# Patient Record
Sex: Female | Born: 1970 | Hispanic: No | Marital: Single | Smoking: Never smoker
Health system: Southern US, Community
[De-identification: ages and names within clinical notes are randomized; demographics above are authoritative.]

## PROBLEM LIST (undated history)

## (undated) DIAGNOSIS — M1990 Unspecified osteoarthritis, unspecified site: Secondary | ICD-10-CM

## (undated) DIAGNOSIS — D582 Other hemoglobinopathies: Secondary | ICD-10-CM

## (undated) DIAGNOSIS — E559 Vitamin D deficiency, unspecified: Secondary | ICD-10-CM

## (undated) HISTORY — DX: Other hemoglobinopathies: D58.2

## (undated) HISTORY — DX: Unspecified osteoarthritis, unspecified site: M19.90

## (undated) HISTORY — DX: Vitamin D deficiency, unspecified: E55.9

## (undated) HISTORY — PX: WISDOM TOOTH EXTRACTION: SHX21

---

## 2016-12-05 NOTE — L&D Delivery Note (Signed)
Delivery Note At 10:32 PM, on Sep 24, 2017,  a viable female "Evelyn Clark" was delivered via Vaginal, Spontaneous Delivery (Presentation: Left Occiput Anterior with restitution to LOT).  Shoulders delivered easily and infant with good tone and spontaneous cry. Tactile stimulation given by provider and infant placed on mother's abdomen where nurse continued tactile stimulation. Infant APGAR: 8, 9.  Cord clamped, cut, and blood collected. Placenta delivered spontaneously and noted to be intact with 3VC upon inspection. Placenta to pathology for elevated BP throughout labor course.  Vaginal inspection revealed bilateral labial and bilateral sulcus lacerations that was repaired with 3-0 vicryl on SH and CT-1, respectively.  No additional anesthetic necessary and patient tolerated the procedure well. Fundus firm, at the umbilicus, and bleeding moderate with constant trickle.  Bimanual sweep of the LUS revealed ~12800mL of clots which was extracted and bleeding decreased to small.  Mother hemodynamically stable and infant skin to skin prior to provider exit.  Mother opts to breastfeed.  Infant weight at one hour of life: 6lb 8oz, 19in  Anesthesia:  Epidural Episiotomy: None Lacerations: Sulcus;Labial Suture Repair: 3.0 vicryl Est. Blood Loss (mL):  800  Mom to postpartum.  Baby to Couplet care / Skin to Skin.  Cherre RobinsJessica L Nolawi Kanady MSN, CNM 09/24/2017, 11:53 PM

## 2017-03-23 LAB — OB RESULTS CONSOLE ANTIBODY SCREEN: Antibody Screen: NEGATIVE

## 2017-03-23 LAB — OB RESULTS CONSOLE HIV ANTIBODY (ROUTINE TESTING): HIV: NONREACTIVE

## 2017-03-23 LAB — OB RESULTS CONSOLE RPR: RPR: NONREACTIVE

## 2017-03-23 LAB — OB RESULTS CONSOLE ABO/RH: RH TYPE: POSITIVE

## 2017-03-23 LAB — OB RESULTS CONSOLE RUBELLA ANTIBODY, IGM: Rubella: IMMUNE

## 2017-03-23 LAB — OB RESULTS CONSOLE GC/CHLAMYDIA
CHLAMYDIA, DNA PROBE: NEGATIVE
GC PROBE AMP, GENITAL: NEGATIVE

## 2017-03-23 LAB — OB RESULTS CONSOLE HEPATITIS B SURFACE ANTIGEN: Hepatitis B Surface Ag: NEGATIVE

## 2017-09-21 ENCOUNTER — Encounter (HOSPITAL_COMMUNITY): Payer: Self-pay | Admitting: *Deleted

## 2017-09-21 ENCOUNTER — Telehealth (HOSPITAL_COMMUNITY): Payer: Self-pay | Admitting: *Deleted

## 2017-09-21 LAB — OB RESULTS CONSOLE GBS: STREP GROUP B AG: NEGATIVE

## 2017-09-21 NOTE — Telephone Encounter (Signed)
Preadmission screen  

## 2017-09-22 ENCOUNTER — Other Ambulatory Visit: Payer: Self-pay | Admitting: Obstetrics and Gynecology

## 2017-09-22 ENCOUNTER — Inpatient Hospital Stay (HOSPITAL_COMMUNITY): Admission: AD | Admit: 2017-09-22 | Payer: Self-pay | Source: Ambulatory Visit | Admitting: Obstetrics and Gynecology

## 2017-09-23 DIAGNOSIS — E559 Vitamin D deficiency, unspecified: Secondary | ICD-10-CM | POA: Insufficient documentation

## 2017-09-23 DIAGNOSIS — O09529 Supervision of elderly multigravida, unspecified trimester: Secondary | ICD-10-CM

## 2017-09-23 DIAGNOSIS — Z8742 Personal history of other diseases of the female genital tract: Secondary | ICD-10-CM | POA: Insufficient documentation

## 2017-09-23 NOTE — H&P (Signed)
Evelyn Clark is a 46 y.o. female, G2P0010 at 3440 2/7 weeks, [facility] She denies HA, visual sx, or epigastric pain, no leaking or bleeding, reports +FM.  Patient Active Problem List   Diagnosis Date Noted  . AMA (advanced maternal age) multigravida 35+ 09/23/2017  . History of infertility 09/23/2017  . Newborn product of in vitro fertilization (IVF) pregnancy 09/23/2017  . Vitamin D deficiency 09/23/2017    History of present pregnancy: Patient entered care at 13 5/7 weeks.   EDC of 09/22/17 was established by LMP and in agreement with US at 6 4/7 weeks.  Anatomy scan:  19 4/7 weeks, with normal findings and an anterior placenta.   Additional US evaluations:  Early scans at 14 and 17 weeks for cervical length; Weekly BPPs from 36 weeks due to AMA--all WNL 36 6/7 weeks--EFW 6+9, 47%ile, normal AFI, vtx. Significant prenatal events:  IVF pregnancy, followed by Dr. Lacey JensenYacinkawa.  Patient questioned need for early cerclage, but no indications, and she elected not to pursue.  Cervical length remained WNL in early pregnancy.  Had issues with appetite, burping, sciatica, general musculoskeletal issues of pregnancy.  Stopped work at 28 weeks due to stress and other issues.  Followed weekly with BPPs from 36 [facility]. Last evaluation:  09/20/17--seen by Dr. Normand Sloopillard, weight 125.5, BP 106/62.  Last documented cervical exam was at 38 5/7 weeks, 1 cm, 90%, vtx, 0 station. Scheduled for induction due to AMA.  OB History    Gravida Para Term Preterm AB Living   2       1     SAB TAB Ectopic Multiple Live Births   1            2003--TAB  Past Medical History:  Diagnosis Date  . Arthritis   . Hemoglobin E trait (HCC)   . Newborn product of in vitro fertilization (IVF) pregnancy   . Vitamin D deficiency    Past Surgical History:  Procedure Laterality Date  . WISDOM TOOTH EXTRACTION     Family History: family history includes Breast cancer in her maternal  aunt; Hyperlipidemia in her mother; Hypertension in her father and mother; Osteoporosis in her mother. Brother with liver transplant.  Social History:  reports that she has never smoked. She has never used smokeless tobacco. Her alcohol and drug histories are not on file.  Patient is [place], from [place], college-educated, has been employed as a Conservation officer, naturecashier at [employer], married to Milana Kidneyick Turpin, who is involved and supportive.   Prenatal Transfer Tool  Maternal Diabetes: No Genetic Screening: Normal AFP Maternal Ultrasounds/Referrals: Normal Fetal Ultrasounds or other Referrals:  None Maternal Substance Abuse:  No Significant Maternal Medications:  None Significant Maternal Lab Results: Lab values include: Group B Strep negative  TDAP declined Flu declined  ROS:  Occasional UCs, positive FM  No Known Allergies   Dilation: 1 Effacement (%): 90 Station: 0 Exam by:: CNM Jakarie Pember Blood pressure 111/79, pulse (!) 58, temperature 97.7 F (36.5 C), temperature source Oral, resp. rate 16, height 5\' 4"  (1.626 m), weight 58.1 kg (128 lb), last menstrual period 12/16/2016.  Chest clear Heart RRR without murmur Abd gravid, NT, FH 40 weeks Pelvic: Cervix very posterior, loose 1 cm, 90%, vtx, 0 station, exam difficult for patient. Ext: WNL  FHR: Category 1 UCs:  Sporadic, mild.  Prenatal labs: ABO, Rh: O/Positive/-- (04/19 0000) Antibody: Negative (04/19 0000) Rubella:  Immune (04/19 0000) RPR: Nonreactive (04/19 0000)  HBsAg: Negative (  04/19 0000)  HIV: Non-reactive (04/19 0000)  GBS: Negative (10/18 0000) Sickle cell/Hgb electrophoresis:  Hgb E Pap:  02/2017, WNL GC:  Neg 03/2017 Chlamydia:  Neg 03/2017 Genetic screenings:  AFP WNL Glucola:  WNL Other:   Hgb 12.6 at NOB, 11.4 at 28 weeks   Assessment/Plan: IUP at 40 2/7 weeks Induction for AMA, age 1. Hx infertility, IVF pregnancy (donor egg from 46 yo) Hgb E trait GBS negative  Plan: Admit to Birthing Suite per consult with Dr.  Normand Sloop Routine [facility] orders Pain med/epidural prn Risks and benefits of induction were reviewed, including failure of method, prolonged labor, need for further intervention, risk of cesarean.  Patient and family seem to understand these risks and wish to proceed. Options of cytotech, foley bulb, AROM, and pitocin reviewed, with use of each discussed. Recommend pitocin for labor induction, AROM as labor advances. Patient will plan epidural.   Billi Bright, VICKICNM, [place] 09/24/2017, 9:16 AM

## 2017-09-24 ENCOUNTER — Inpatient Hospital Stay (HOSPITAL_COMMUNITY): Payer: Managed Care, Other (non HMO) | Admitting: Anesthesiology

## 2017-09-24 ENCOUNTER — Encounter (HOSPITAL_COMMUNITY): Payer: Self-pay

## 2017-09-24 ENCOUNTER — Inpatient Hospital Stay (HOSPITAL_COMMUNITY)
Admission: RE | Admit: 2017-09-24 | Discharge: 2017-09-26 | DRG: 806 | Disposition: A | Discharge status: Home / Self Care | Payer: Managed Care, Other (non HMO) | Source: Ambulatory Visit | Attending: Obstetrics and Gynecology | Admitting: Obstetrics and Gynecology

## 2017-09-24 DIAGNOSIS — O9902 Anemia complicating childbirth: Secondary | ICD-10-CM | POA: Diagnosis present

## 2017-09-24 DIAGNOSIS — O09529 Supervision of elderly multigravida, unspecified trimester: Secondary | ICD-10-CM

## 2017-09-24 DIAGNOSIS — O26893 Other specified pregnancy related conditions, third trimester: Principal | ICD-10-CM | POA: Diagnosis present

## 2017-09-24 DIAGNOSIS — E559 Vitamin D deficiency, unspecified: Secondary | ICD-10-CM

## 2017-09-24 DIAGNOSIS — D649 Anemia, unspecified: Secondary | ICD-10-CM | POA: Diagnosis present

## 2017-09-24 DIAGNOSIS — O09523 Supervision of elderly multigravida, third trimester: Secondary | ICD-10-CM

## 2017-09-24 DIAGNOSIS — O9912 Other diseases of the blood and blood-forming organs and certain disorders involving the immune mechanism complicating childbirth: Secondary | ICD-10-CM | POA: Diagnosis present

## 2017-09-24 DIAGNOSIS — D693 Immune thrombocytopenic purpura: Secondary | ICD-10-CM | POA: Diagnosis present

## 2017-09-24 DIAGNOSIS — O134 Gestational [pregnancy-induced] hypertension without significant proteinuria, complicating childbirth: Secondary | ICD-10-CM | POA: Diagnosis present

## 2017-09-24 DIAGNOSIS — Z8742 Personal history of other diseases of the female genital tract: Secondary | ICD-10-CM

## 2017-09-24 DIAGNOSIS — Z3A40 40 weeks gestation of pregnancy: Secondary | ICD-10-CM | POA: Diagnosis not present

## 2017-09-24 LAB — CBC
HCT: 36 % (ref 36.0–46.0)
HEMATOCRIT: 33.6 % — AB (ref 36.0–46.0)
HEMOGLOBIN: 12.2 g/dL (ref 12.0–15.0)
HEMOGLOBIN: 11.1 g/dL — AB (ref 12.0–15.0)
MCH: 25.2 pg — AB (ref 26.0–34.0)
MCH: 25.2 pg — AB (ref 26.0–34.0)
MCHC: 33.9 g/dL (ref 30.0–36.0)
MCHC: 33 g/dL (ref 30.0–36.0)
MCV: 74.2 fL — AB (ref 78.0–100.0)
MCV: 76.2 fL — ABNORMAL LOW (ref 78.0–100.0)
Platelets: 158 10*3/uL (ref 150–400)
Platelets: 143 10*3/uL — ABNORMAL LOW (ref 150–400)
RBC: 4.85 MIL/uL (ref 3.87–5.11)
RBC: 4.41 MIL/uL (ref 3.87–5.11)
RDW: 15.2 % (ref 11.5–15.5)
RDW: 15 % (ref 11.5–15.5)
WBC: 5.3 10*3/uL (ref 4.0–10.5)
WBC: 4.9 10*3/uL (ref 4.0–10.5)

## 2017-09-24 LAB — COMPREHENSIVE METABOLIC PANEL
ALBUMIN: 3.2 g/dL — AB (ref 3.5–5.0)
ALK PHOS: 252 U/L — AB (ref 38–126)
ALT: 15 U/L (ref 14–54)
ANION GAP: 7 (ref 5–15)
AST: 28 U/L (ref 15–41)
BUN: 13 mg/dL (ref 6–20)
CHLORIDE: 102 mmol/L (ref 101–111)
CO2: 24 mmol/L (ref 22–32)
CREATININE: 1.32 mg/dL — AB (ref 0.44–1.00)
Calcium: 9.8 mg/dL (ref 8.9–10.3)
GFR calc non Af Amer: 47 mL/min — ABNORMAL LOW (ref >60)
GFR, EST AFRICAN AMERICAN: 55 mL/min — AB (ref >60)
GLUCOSE: 84 mg/dL (ref 65–99)
Potassium: 4.6 mmol/L (ref 3.5–5.1)
SODIUM: 133 mmol/L — AB (ref 135–145)
Total Bilirubin: 0.7 mg/dL (ref 0.3–1.2)
Total Protein: 6.7 g/dL (ref 6.5–8.1)

## 2017-09-24 LAB — PROTEIN / CREATININE RATIO, URINE
Creatinine, Urine: 111 mg/dL
PROTEIN CREATININE RATIO: 0.1 mg/mg{creat} (ref 0.00–0.15)
TOTAL PROTEIN, URINE: 11 mg/dL

## 2017-09-24 LAB — URIC ACID: URIC ACID, SERUM: 7 mg/dL — AB (ref 2.3–6.6)

## 2017-09-24 LAB — TYPE AND SCREEN
ABO/RH(D): O POS
Antibody Screen: NEGATIVE

## 2017-09-24 LAB — LACTATE DEHYDROGENASE: LDH: 156 U/L (ref 98–192)

## 2017-09-24 LAB — ABO/RH: ABO/RH(D): O POS

## 2017-09-24 MED ORDER — PHENYLEPHRINE 40 MCG/ML (10ML) SYRINGE FOR IV PUSH (FOR BLOOD PRESSURE SUPPORT)
80.0000 ug | PREFILLED_SYRINGE | INTRAVENOUS | Status: DC | PRN
  Filled 2017-09-24: qty 10
  Filled 2017-09-24: qty 5

## 2017-09-24 MED ORDER — OXYCODONE-ACETAMINOPHEN 5-325 MG PO TABS: 1.0000 | ORAL_TABLET | ORAL | Status: DC | PRN

## 2017-09-24 MED ORDER — EPHEDRINE 5 MG/ML INJ
10.0000 mg | INTRAVENOUS | Status: DC | PRN
  Filled 2017-09-24: qty 2

## 2017-09-24 MED ORDER — LACTATED RINGERS IV SOLN
500.0000 mL | Freq: Once | INTRAVENOUS | Status: AC
  Administered 2017-09-24: 500 mL via INTRAVENOUS

## 2017-09-24 MED ORDER — OXYTOCIN BOLUS FROM INFUSION
500.0000 mL | Freq: Once | INTRAVENOUS | Status: AC
  Administered 2017-09-24: 500 mL via INTRAVENOUS

## 2017-09-24 MED ORDER — OXYCODONE-ACETAMINOPHEN 5-325 MG PO TABS: 2.0000 | ORAL_TABLET | ORAL | Status: DC | PRN

## 2017-09-24 MED ORDER — SOD CITRATE-CITRIC ACID 500-334 MG/5ML PO SOLN: 30.0000 mL | ORAL | Status: DC | PRN

## 2017-09-24 MED ORDER — MISOPROSTOL 25 MCG QUARTER TABLET
25.0000 ug | ORAL_TABLET | ORAL | Status: DC | PRN
  Filled 2017-09-24: qty 1

## 2017-09-24 MED ORDER — DIPHENHYDRAMINE HCL 50 MG/ML IJ SOLN: 12.5000 mg | INTRAMUSCULAR | Status: DC | PRN

## 2017-09-24 MED ORDER — FENTANYL 2.5 MCG/ML BUPIVACAINE 1/10 % EPIDURAL INFUSION (WH - ANES)
14.0000 mL/h | INTRAMUSCULAR | Status: DC | PRN
  Administered 2017-09-24: 14 mL/h via EPIDURAL
  Filled 2017-09-24: qty 100

## 2017-09-24 MED ORDER — LABETALOL HCL 5 MG/ML IV SOLN
INTRAVENOUS | Status: AC
  Administered 2017-09-24: 20 mg via INTRAVENOUS
  Filled 2017-09-24: qty 4

## 2017-09-24 MED ORDER — TERBUTALINE SULFATE 1 MG/ML IJ SOLN
0.2500 mg | Freq: Once | INTRAMUSCULAR | Status: DC | PRN
  Filled 2017-09-24: qty 1

## 2017-09-24 MED ORDER — LACTATED RINGERS IV SOLN
500.0000 mL | INTRAVENOUS | Status: DC | PRN
  Administered 2017-09-24: 500 mL via INTRAVENOUS

## 2017-09-24 MED ORDER — PHENYLEPHRINE 40 MCG/ML (10ML) SYRINGE FOR IV PUSH (FOR BLOOD PRESSURE SUPPORT)
80.0000 ug | PREFILLED_SYRINGE | INTRAVENOUS | Status: DC | PRN
  Filled 2017-09-24: qty 5

## 2017-09-24 MED ORDER — FLEET ENEMA 7-19 GM/118ML RE ENEM: 1.0000 | ENEMA | RECTAL | Status: DC | PRN

## 2017-09-24 MED ORDER — HYDRALAZINE HCL 20 MG/ML IJ SOLN: 10.0000 mg | Freq: Once | INTRAMUSCULAR | Status: DC | PRN

## 2017-09-24 MED ORDER — ONDANSETRON HCL 4 MG/2ML IJ SOLN
4.0000 mg | Freq: Four times a day (QID) | INTRAMUSCULAR | Status: DC | PRN
  Administered 2017-09-24: 4 mg via INTRAVENOUS
  Filled 2017-09-24 (×2): qty 2

## 2017-09-24 MED ORDER — OXYTOCIN 40 UNITS IN LACTATED RINGERS INFUSION - SIMPLE MED
1.0000 m[IU]/min | INTRAVENOUS | Status: DC
  Administered 2017-09-24: 1 m[IU]/min via INTRAVENOUS
  Filled 2017-09-24: qty 1000

## 2017-09-24 MED ORDER — LABETALOL HCL 5 MG/ML IV SOLN
20.0000 mg | INTRAVENOUS | Status: DC | PRN
  Administered 2017-09-24: 20 mg via INTRAVENOUS

## 2017-09-24 MED ORDER — FENTANYL CITRATE (PF) 100 MCG/2ML IJ SOLN
100.0000 ug | INTRAMUSCULAR | Status: DC | PRN
  Administered 2017-09-24: 100 ug via INTRAVENOUS
  Filled 2017-09-24: qty 2

## 2017-09-24 MED ORDER — ACETAMINOPHEN 325 MG PO TABS: 650.0000 mg | ORAL_TABLET | ORAL | Status: DC | PRN

## 2017-09-24 MED ORDER — LACTATED RINGERS IV SOLN
INTRAVENOUS | Status: DC
  Administered 2017-09-24: 20:00:00 via INTRAVENOUS
  Administered 2017-09-24 (×2): 125 mL/h via INTRAVENOUS

## 2017-09-24 MED ORDER — LIDOCAINE HCL (PF) 1 % IJ SOLN
INTRAMUSCULAR | Status: DC | PRN
  Administered 2017-09-24: 3 mL
  Administered 2017-09-24: 2 mL
  Administered 2017-09-24: 5 mL

## 2017-09-24 MED ORDER — OXYTOCIN 40 UNITS IN LACTATED RINGERS INFUSION - SIMPLE MED
2.5000 [IU]/h | INTRAVENOUS | Status: DC
  Administered 2017-09-24: 2.5 [IU]/h via INTRAVENOUS

## 2017-09-24 MED ORDER — LIDOCAINE HCL (PF) 1 % IJ SOLN
30.0000 mL | INTRAMUSCULAR | Status: DC | PRN
  Filled 2017-09-24: qty 30

## 2017-09-24 NOTE — Progress Notes (Signed)
[facility] of patient's elevated BP. Range [id number]. Denies HA, visual sx, or epigastric pain.  Pulse 45-59.  CBC    Component Value Date/Time   WBC 5.3 09/24/2017 0800   RBC 4.85 09/24/2017 0800   HGB 12.2 09/24/2017 0800   HCT 36.0 09/24/2017 0800   PLT 158 09/24/2017 0800   MCV 74.2 (L) 09/24/2017 0800   MCH 25.2 (L) 09/24/2017 0800   MCHC 33.9 09/24/2017 0800   RDW 15.2 09/24/2017 0800   CMP Latest Ref Rng & Units 09/24/2017  Glucose 65 - 99 mg/dL 84  BUN 6 - 20 mg/dL 13  Creatinine 1.610.44 - 0.961.00 mg/dL 0.45(W1.32(H)  Sodium 098135 - 119145 mmol/L 133(L)  Potassium 3.5 - 5.1 mmol/L 4.6  Chloride 101 - 111 mmol/L 102  CO2 22 - 32 mmol/L 24  Calcium 8.9 - 10.3 mg/dL 9.8  Total Protein 6.5 - 8.1 g/dL 6.7  Total Bilirubin 0.3 - 1.2 mg/dL 0.7  Alkaline Phos 38 - 126 U/L 252(H)  AST 15 - 41 U/L 28  ALT 14 - 54 U/L 15   Implemented hypertensive order set. Check PCR.  Pit on 9 mu/min. UCs q 3 min, moderate FHR Cat 1.  Will continue to observe.  Nigel BridgemanVicki Hero Mccathern, CNM 09/24/17 12:30p

## 2017-09-24 NOTE — Progress Notes (Signed)
Evelyn Clark MRN: [id number]  Subjective: -Care assumed of 46 y.o. G2P0 at 4727w2d who presents for IOL s/t AMA. Patient is under the care of [facility] and pregnancy history significant for infertility with IVF pregnancy via donor egg.  Patient has been experiencing labile blood pressures since presenting, with negative PIH labs.  In room to meet acquaintance of patient and family.  Patient resting in bed reports intermittent lower abdominal cramping and the urge to urinate.  Reports hitting PCA prior to provider arrival.   Objective: BP (!) 143/73   Pulse (!) 46   Temp 99 F (37.2 C) (Oral)   Resp 16   Ht 5\' 4"  (1.626 m)   Wt 58.1 kg (128 lb)   LMP 12/16/2016   SpO2 99%   BMI 21.97 kg/m  No intake/output data recorded. No intake/output data recorded.  Fetal Monitoring: FHT: 120 bpm, Mod Var, + Early Decels, +Accels UC: Q1-773min, palpates strong    Physical Exam: General appearance: alert, well appearing, and in no distress. Chest: normal rate and regular rhythm.  clear to auscultation, no wheezes, rales or rhonchi, symmetric air entry. Abdominal exam: Soft RT, NT, BS present. Extremities: +3 Edema in BLE Skin exam: Warm Dry  Vaginal Exam: SVE:   Dilation: 10 Effacement (%): 100 Station: +2 Exam by:: Evelyn Clark, CNM  Membranes:AROM  Internal Monitors: None  Augmentation/Induction: Pitocin:779mUn/min  Cytotec: None  Assessment:  IUP at 40.2wks Cat I FT  AMA Elevated BP 2nd Stage Labor  Plan: -Patient without urge to push and practice push mediocre at best -Allow to labor down until urge, but no longer than 1 hour -Nurse reports patient received 2 doses labetalol in earlier shift. -Will repeat PIH labs in AM -Continue other mgmt as ordered -Anticipate SVD  Evelyn CavaJessica L Lonnel Gjerde,MSN, CNM 09/24/2017, 8:54 PM

## 2017-09-24 NOTE — Anesthesia Preprocedure Evaluation (Signed)
Anesthesia Evaluation  Patient identified by MRN, date of birth, ID band Patient awake    Reviewed: Allergy & Precautions, Patient's Chart, lab work & pertinent test results  Airway Mallampati: II  TM Distance: >3 FB     Dental   Pulmonary neg pulmonary ROS,    Pulmonary exam normal        Cardiovascular negative cardio ROS Normal cardiovascular exam     Neuro/Psych negative neurological ROS     GI/Hepatic negative GI ROS, Neg liver ROS,   Endo/Other  negative endocrine ROS  Renal/GU negative Renal ROS     Musculoskeletal  (+) Arthritis ,   Abdominal   Peds  Hematology negative hematology ROS (+)   Anesthesia Other Findings   Reproductive/Obstetrics (+) Pregnancy                             Lab Results  Component Value Date   WBC 4.9 09/24/2017   HGB 11.1 (L) 09/24/2017   HCT 33.6 (L) 09/24/2017   MCV 76.2 (L) 09/24/2017   PLT 143 (L) 09/24/2017    Anesthesia Physical Anesthesia Plan  ASA: II  Anesthesia Plan: Epidural   Post-op Pain Management:    Induction:   PONV Risk Score and Plan: Treatment may vary due to age or medical condition  Airway Management Planned: Natural Airway  Additional Equipment:   Intra-op Plan:   Post-operative Plan:   Informed Consent: I have reviewed the patients History and Physical, chart, labs and discussed the procedure including the risks, benefits and alternatives for the proposed anesthesia with the patient or authorized representative who has indicated his/her understanding and acceptance.     Plan Discussed with:   Anesthesia Plan Comments:         Anesthesia Quick Evaluation

## 2017-09-24 NOTE — Anesthesia Procedure Notes (Signed)
Epidural Patient location during procedure: OB Start time: 09/24/2017 5:35 PM End time: 09/24/2017 5:40 PM  Staffing Anesthesiologist: Marcene DuosFITZGERALD, Ike Maragh Performed: anesthesiologist   Preanesthetic Checklist Completed: patient identified, site marked, surgical consent, pre-op evaluation, timeout performed, IV checked, risks and benefits discussed and monitors and equipment checked  Epidural Patient position: sitting Prep: site prepped and draped and DuraPrep Patient monitoring: continuous pulse ox and blood pressure Approach: midline Location: L4-L5 Injection technique: LOR air  Needle:  Needle type: Tuohy  Needle gauge: 17 G Needle length: 9 cm and 9 Needle insertion depth: 5 cm and 4.5 cm Catheter type: closed end flexible Catheter size: 19 Gauge Catheter at skin depth: 9.5 cm Test dose: negative  Assessment Events: blood not aspirated, injection not painful, no injection resistance, negative IV test and no paresthesia

## 2017-09-24 NOTE — Anesthesia Pain Management Evaluation Note (Signed)
  CRNA Pain Management Visit Note  Patient: Evelyn Clark, 46 y.o., female  "Hello I am a [facility]. We have an anesthesia team available at all times to provide care throughout the hospital, including epidural management and anesthesia for C-section. I don't know your plan for the delivery whether it a natural birth, water birth, IV sedation, nitrous supplementation, doula or epidural, but we want to meet your pain goals."   1.Was your pain managed to your expectations on prior hospitalizations?   No prior hospitalizations  2.What is your expectation for pain management during this hospitalization?     Epidural  3.How can we help you reach that goal? Support prn  Record the patient's initial score and the patient's pain goal.   Pain: 2  Pain Goal: 7 [facility] wants you to be able to say your pain was always managed very well.  [facility]WRINKLE,Evelyn Osgood 09/24/2017

## 2017-09-24 NOTE — Progress Notes (Signed)
  Subjective: Just received epidural--more comfortable, but still aware of pain in lower abdomen.  Objective: BP (!) 158/83 (BP Location: Left Arm)   Pulse 62   Temp 99 F (37.2 C) (Oral)   Resp 16   Ht 5\' 4"  (1.626 m)   Wt 58.1 kg (128 lb)   LMP 12/16/2016   SpO2 99%   BMI 21.97 kg/m  No intake/output data recorded. No intake/output data recorded.   Vitals:   09/24/17 1826 09/24/17 1830 09/24/17 1835 09/24/17 1840  BP: (!) 149/86 112/78 135/75 (!) 158/83  Pulse: (!) 51 (!) 51 (!) 47 62  Resp: 16 16 16 16   Temp:      TempSrc:      SpO2: 99% 99% 100% 99%  Weight:      Height:        FHT: Category 2--decreased variability, occasional very quick variables. UC:   regular, every 3 minutes SVE:   Dilation: 5.5 Effacement (%): 90 Station: 0 Exam by:: Doloris HallJenny Middleton RN  Positive response to scalp stim. Pitocin at 17 mu/min  Assessment:  Induction for AMA Active labor Mildly elevated creatinine Labile BP Mild thrombocytopenia  Plan: Continue current care. Decreased pit to 8 mu/min Position to facilitate rotation/descent. Dr. Normand Sloopillard updated.  Nigel BridgemanLATHAM, Lavere Stork CNM 09/24/2017, 6:48 PM

## 2017-09-24 NOTE — Progress Notes (Addendum)
Subjective: Reports contractions are less intense now.  Denies HA, visual sx, epigastric pain.  Objective: BP (!) 145/69 (BP Location: Left Arm)   Pulse (!) 56   Temp 98.7 F (37.1 C) (Oral)   Resp 16   Ht 5\' 4"  (1.626 m)   Wt 58.1 kg (128 lb)   LMP 12/16/2016   BMI 21.97 kg/m  No intake/output data recorded. No intake/output data recorded.   Received IV Labetalol 20 mg at 1226.  Vitals:   09/24/17 1431 09/24/17 1512 09/24/17 1531 09/24/17 1547  BP: 117/69  (!) 145/69   Pulse: 66  (!) 56   Resp: 16 16  16   Temp:      TempSrc:      Weight:      Height:        FHT: Category 1 UC:   irregular, every 4-6 minutes SVE:   Dilation: 3 Effacement (%): 90 Station: 0 Exam by:: CNM Jani Moronta  Cervix still posterior Pitocin at 17 mu/min AROM--small amount clear fluid  Results for orders placed or performed during the hospital encounter of 09/24/17 (from the past 24 hour(s))  CBC     Status: Abnormal   Collection Time: 09/24/17  8:00 AM  Result Value Ref Range   WBC 5.3 4.0 - 10.5 K/uL   RBC 4.85 3.87 - 5.11 MIL/uL   Hemoglobin 12.2 12.0 - 15.0 g/dL   HCT 47.836.0 29.536.0 - 62.146.0 %   MCV 74.2 (L) 78.0 - 100.0 fL   MCH 25.2 (L) 26.0 - 34.0 pg   MCHC 33.9 30.0 - 36.0 g/dL   RDW 30.815.2 65.711.5 - 84.615.5 %   Platelets 158 150 - 400 K/uL  Comprehensive metabolic panel     Status: Abnormal   Collection Time: 09/24/17  8:00 AM  Result Value Ref Range   Sodium 133 (L) 135 - 145 mmol/L   Potassium 4.6 3.5 - 5.1 mmol/L   Chloride 102 101 - 111 mmol/L   CO2 24 22 - 32 mmol/L   Glucose, Bld 84 65 - 99 mg/dL   BUN 13 6 - 20 mg/dL   Creatinine, Ser 9.621.32 (H) 0.44 - 1.00 mg/dL   Calcium 9.8 8.9 - 95.210.3 mg/dL   Total Protein 6.7 6.5 - 8.1 g/dL   Albumin 3.2 (L) 3.5 - 5.0 g/dL   AST 28 15 - 41 U/L   ALT 15 14 - 54 U/L   Alkaline Phosphatase 252 (H) 38 - 126 U/L   Total Bilirubin 0.7 0.3 - 1.2 mg/dL   GFR calc non Af Amer 47 (L) >60 mL/min   GFR calc Af Amer 55 (L) >60 mL/min   Anion gap 7 5 -  15  Lactate dehydrogenase     Status: None   Collection Time: 09/24/17  8:00 AM  Result Value Ref Range   LDH 156 98 - 192 U/L  Uric acid     Status: Abnormal   Collection Time: 09/24/17  8:00 AM  Result Value Ref Range   Uric Acid, Serum 7.0 (H) 2.3 - 6.6 mg/dL  Type and screen [facility]     Status: None   Collection Time: 09/24/17  8:09 AM  Result Value Ref Range   ABO/RH(D) O POS    Antibody Screen NEG    Sample Expiration 09/27/2017   ABO/Rh     Status: None   Collection Time: 09/24/17  8:09 AM  Result Value Ref Range   ABO/RH(D) O POS   Protein /  creatinine ratio, urine     Status: None   Collection Time: 09/24/17  1:40 PM  Result Value Ref Range   Creatinine, Urine 111.00 mg/dL   Total Protein, Urine 11 mg/dL   Protein Creatinine Ratio 0.10 0.00 - 0.15 mg/mg[Cre]    Assessment:  Induction for AMA GBS negative Labile BP, no evidence pre-eclampsia Elevated creatinine  Plan: Continue pitocin induction. Epidural prn Continue to observe BP Repeat CBC now (last draw at 8am) Repeat PIH labs in am.  Nigel Bridgeman CNM 09/24/2017, 4:05 PM

## 2017-09-25 ENCOUNTER — Encounter (HOSPITAL_COMMUNITY): Payer: Self-pay

## 2017-09-25 LAB — COMPREHENSIVE METABOLIC PANEL
ALT: 15 U/L (ref 14–54)
ANION GAP: 7 (ref 5–15)
AST: 33 U/L (ref 15–41)
Albumin: 2.4 g/dL — ABNORMAL LOW (ref 3.5–5.0)
Alkaline Phosphatase: 171 U/L — ABNORMAL HIGH (ref 38–126)
BUN: 10 mg/dL (ref 6–20)
CHLORIDE: 105 mmol/L (ref 101–111)
CO2: 23 mmol/L (ref 22–32)
Calcium: 8.5 mg/dL — ABNORMAL LOW (ref 8.9–10.3)
Creatinine, Ser: 1.19 mg/dL — ABNORMAL HIGH (ref 0.44–1.00)
GFR, EST NON AFRICAN AMERICAN: 53 mL/min — AB (ref >60)
Glucose, Bld: 137 mg/dL — ABNORMAL HIGH (ref 65–99)
POTASSIUM: 3.8 mmol/L (ref 3.5–5.1)
Sodium: 135 mmol/L (ref 135–145)
Total Bilirubin: 0.7 mg/dL (ref 0.3–1.2)
Total Protein: 5.2 g/dL — ABNORMAL LOW (ref 6.5–8.1)

## 2017-09-25 LAB — CBC
HCT: 26.9 % — ABNORMAL LOW (ref 36.0–46.0)
HEMATOCRIT: 31.4 % — AB (ref 36.0–46.0)
HEMOGLOBIN: 10.4 g/dL — AB (ref 12.0–15.0)
Hemoglobin: 9.3 g/dL — ABNORMAL LOW (ref 12.0–15.0)
MCH: 24.8 pg — AB (ref 26.0–34.0)
MCH: 25.6 pg — AB (ref 26.0–34.0)
MCHC: 33.1 g/dL (ref 30.0–36.0)
MCHC: 34.6 g/dL (ref 30.0–36.0)
MCV: 74.9 fL — ABNORMAL LOW (ref 78.0–100.0)
MCV: 74.1 fL — AB (ref 78.0–100.0)
PLATELETS: 136 10*3/uL — AB (ref 150–400)
Platelets: 151 10*3/uL (ref 150–400)
RBC: 4.19 MIL/uL (ref 3.87–5.11)
RBC: 3.63 MIL/uL — ABNORMAL LOW (ref 3.87–5.11)
RDW: 15.6 % — ABNORMAL HIGH (ref 11.5–15.5)
RDW: 15 % (ref 11.5–15.5)
WBC: 15.7 10*3/uL — ABNORMAL HIGH (ref 4.0–10.5)
WBC: 15.4 10*3/uL — ABNORMAL HIGH (ref 4.0–10.5)

## 2017-09-25 LAB — RPR: RPR: NONREACTIVE

## 2017-09-25 LAB — URIC ACID: URIC ACID, SERUM: 5.8 mg/dL (ref 2.3–6.6)

## 2017-09-25 LAB — LACTATE DEHYDROGENASE: LDH: 158 U/L (ref 98–192)

## 2017-09-25 MED ORDER — IBUPROFEN 600 MG PO TABS
600.0000 mg | ORAL_TABLET | Freq: Four times a day (QID) | ORAL | Status: DC
  Administered 2017-09-25 – 2017-09-26 (×6): 600 mg via ORAL
  Filled 2017-09-25 (×5): qty 1

## 2017-09-25 MED ORDER — BENZOCAINE-MENTHOL 20-0.5 % EX AERO
1.0000 "application " | INHALATION_SPRAY | CUTANEOUS | Status: DC | PRN
  Administered 2017-09-25: 1 via TOPICAL
  Filled 2017-09-25: qty 56

## 2017-09-25 MED ORDER — ACETAMINOPHEN 325 MG PO TABS: 650.0000 mg | ORAL_TABLET | ORAL | Status: DC | PRN

## 2017-09-25 MED ORDER — DIPHENHYDRAMINE HCL 25 MG PO CAPS: 25.0000 mg | ORAL_CAPSULE | Freq: Four times a day (QID) | ORAL | Status: DC | PRN

## 2017-09-25 MED ORDER — PRENATAL MULTIVITAMIN CH
1.0000 | ORAL_TABLET | Freq: Every day | ORAL | Status: DC
  Administered 2017-09-25: 1 via ORAL
  Filled 2017-09-25: qty 1

## 2017-09-25 MED ORDER — SENNOSIDES-DOCUSATE SODIUM 8.6-50 MG PO TABS
2.0000 | ORAL_TABLET | ORAL | Status: DC
  Administered 2017-09-25 – 2017-09-26 (×2): 2 via ORAL
  Filled 2017-09-25 (×3): qty 2

## 2017-09-25 MED ORDER — ZOLPIDEM TARTRATE 5 MG PO TABS: 5.0000 mg | ORAL_TABLET | Freq: Every evening | ORAL | Status: DC | PRN

## 2017-09-25 MED ORDER — ONDANSETRON HCL 4 MG/2ML IJ SOLN: 4.0000 mg | INTRAMUSCULAR | Status: DC | PRN

## 2017-09-25 MED ORDER — WITCH HAZEL-GLYCERIN EX PADS
1.0000 "application " | MEDICATED_PAD | CUTANEOUS | Status: DC | PRN
  Administered 2017-09-25: 1 via TOPICAL

## 2017-09-25 MED ORDER — TETANUS-DIPHTH-ACELL PERTUSSIS 5-2.5-18.5 LF-MCG/0.5 IM SUSP: 0.5000 mL | Freq: Once | INTRAMUSCULAR | Status: DC

## 2017-09-25 MED ORDER — COCONUT OIL OIL: 1.0000 "application " | TOPICAL_OIL | Status: DC | PRN

## 2017-09-25 MED ORDER — ONDANSETRON HCL 4 MG PO TABS: 4.0000 mg | ORAL_TABLET | ORAL | Status: DC | PRN

## 2017-09-25 MED ORDER — SIMETHICONE 80 MG PO CHEW: 80.0000 mg | CHEWABLE_TABLET | ORAL | Status: DC | PRN

## 2017-09-25 MED ORDER — DIBUCAINE 1 % RE OINT: 1.0000 "application " | TOPICAL_OINTMENT | RECTAL | Status: DC | PRN

## 2017-09-25 NOTE — Anesthesia Postprocedure Evaluation (Signed)
Anesthesia Post Note  Patient: Evelyn Clark  Procedure(s) Performed: AN AD HOC LABOR EPIDURAL     Patient location during evaluation: Mother Baby Anesthesia Type: Epidural Level of consciousness: awake and alert and oriented Pain management: satisfactory to patient Vital Signs Assessment: post-procedure vital signs reviewed and stable Respiratory status: respiratory function stable Cardiovascular status: stable Postop Assessment: no headache, no backache, epidural receding, patient able to bend at knees, no signs of nausea or vomiting and adequate PO intake Anesthetic complications: no    Last Vitals:  Vitals:   09/25/17 0215 09/25/17 0634  BP: (!) 146/83 130/69  Pulse: 72 (!) 53  Resp: 18 18  Temp: 36.7 C 36.8 C  SpO2: 100%     Last Pain:  Vitals:   09/25/17 0634  TempSrc: Oral  PainSc: 0-No pain   Pain Goal:                 [name]

## 2017-09-25 NOTE — Progress Notes (Signed)
Post Partum Day 1 Subjective: Doing OK. No headache.  Objective: Blood pressure 130/69, pulse (!) 53, temperature 98.2 F (36.8 C), temperature source Oral, resp. rate 18, height 5\' 4"  (1.626 m), weight 58.1 kg (128 lb), last menstrual period 12/16/2016, SpO2 100 %, unknown if currently breastfeeding.  Physical Exam:  General: alert and no distress Lochia: appropriate Uterine Fundus: firm Incision: NA DVT Evaluation: No evidence of DVT seen on physical exam.   Recent Labs  09/25/17 0111 09/25/17 0505  HGB 10.4* 9.3*  HCT 31.4* 26.9*    Assessment/Plan: Plan for discharge tomorrow  Breast feeding. Contraception discussed. Repeat CBC in the morning.   LOS: 1 day   Janine LimboSTRINGER,Domonic Hiscox V 09/25/2017, 4:35 PM

## 2017-09-25 NOTE — Lactation Note (Addendum)
This note was copied from a baby's chart. Lactation Consultation Note Baby 6 hrs old. Mom AMA 46 yrs old,had IVF. Mom has small breast w/wide space between breast. Small areolas and everted nipples. Mom hand expressed colostrum. Mom stated baby has BF well. Discussed feeding cues and BF every 2-3 hours and cues. Mom encouraged to feed baby 8-12 times/24 hours and with feeding cues. Educated STS< I&O< cluster feeding, supply and demand.  Encouraged to call for assistance if needed. Mom appears to want to do things her way??  [facility]/LC brochure given w/resources, support groups and LC services.  Patient Name: Evelyn Clark Today's Date: 09/25/2017 Reason for consult: Initial assessment   Maternal Data Has patient been taught Hand Expression?: Yes  Feeding Feeding Type: Breast Fed Length of feed: 35 min  LATCH Score       Type of Nipple: Everted at rest and after stimulation  Comfort (Breast/Nipple): Soft / non-tender        Interventions Interventions: Breast feeding basics reviewed;Breast compression;Support pillows;Breast massage;Position options;Hand express  Lactation Tools Discussed/Used     Consult Status Consult Status: Follow-up Date: 09/26/17 Follow-up type: In-patient    Charyl DancerCARVER, Mehran Guderian G 09/25/2017, 4:36 AM

## 2017-09-25 NOTE — Progress Notes (Signed)
Got patient out of bed with a stedy and took her to the bathroom to empty her bladder. Pt felt as if she had to pee but was not able to urinate. Pt's bleeding was stable and her bladder had been emptied with a red robin after repair. Nurse on Harold BarbanMBU, Judy, reported that it was ok if patient did not receive an I&O cath at this time.

## 2017-09-26 LAB — CBC
HEMATOCRIT: 22.2 % — AB (ref 36.0–46.0)
HEMOGLOBIN: 7.5 g/dL — AB (ref 12.0–15.0)
MCH: 25.2 pg — AB (ref 26.0–34.0)
MCHC: 33.8 g/dL (ref 30.0–36.0)
MCV: 74.5 fL — AB (ref 78.0–100.0)
Platelets: 126 10*3/uL — ABNORMAL LOW (ref 150–400)
RBC: 2.98 MIL/uL — ABNORMAL LOW (ref 3.87–5.11)
RDW: 15.3 % (ref 11.5–15.5)
WBC: 14.2 10*3/uL — ABNORMAL HIGH (ref 4.0–10.5)

## 2017-09-26 MED ORDER — FERROUS SULFATE 325 (65 FE) MG PO TBEC: 325.0000 mg | DELAYED_RELEASE_TABLET | Freq: Two times a day (BID) | ORAL | 3 refills | Status: DC

## 2017-09-26 MED ORDER — IBUPROFEN 600 MG PO TABS: 600.0000 mg | ORAL_TABLET | Freq: Four times a day (QID) | ORAL | 0 refills | Status: DC

## 2017-09-26 NOTE — Lactation Note (Signed)
This note was copied from a baby's chart. Lactation Consultation Note  Patient Name: Evelyn Clark Today's Date: 09/26/2017 Reason for consult: Follow-up assessment;Nipple pain/trauma   Follow up with first time mom of 35 hour old infant. Infant with 11 BF for 15-45 minutes, 1 BF attempt, 3 voids and 2 stools in the last 24 hours. Infant weight 6 lb 1.9 oz with 6% weight loss since birth. LATCH scores 7-9.   Mom reports infant has been cluster feeding. She reports nipple pain throughout feedings over the last 24 hours. Mom is noted to have bruises to both nipples and positional stripe to left nipple, nipple tissue intact. Comfort gels given with instructions for use a cleaning. Infant noted to have high palate with oral exam. She is able to extend tongue over gumline, she did not want to suckle on gloved finger at this time. Infant is noted to have a labial frenulum that inserts near the bottom of the gum ridge, her upper lip is stretchable. Mom is noted to have small firm breasts with small areola and small everted nipples. Mom is noted to have wide space between nipples. Mom reports positive breast changes with pregnancy. Mom reports her breasts are feeling fuller today.   Infant was cueing to feed, mom latched infant to the left breast in the cradle hold. Added pillow for support and enc parents to use pillow and head support throughout feeding. Infant sucked nipple through gums and did not flange lips well. Mom reports pain with feeding and infant noted to have deep cheek dimpling. Unlatched infant and assisted mom with latching infant to the breast in the cross cradle hold. Discussed waiting on wide open mouth to latch infant. After a few tries, infant was able to latch deeper and cheek dimpling disappeared. Mom reports the latch was still tender but much better. Infant noted to have rhythmic suckles and swallows. Mom was giving infant breathing space, worked with mom in giving breathing  space with positioning and using hand to massage/ compress breast with feeding. Infant did get sleepy in the middle of the feeding, worked with mom on awakening techniques and enc mom to use when infant not actively feeding at the breast. Infant nursed for 10 minutes and fell asleep. Mom reports her breast felt softer. Mom expressed EBM to nipples post BF. Left nipple was noted to have a crease to the 11 o'clock position where infant lips creased while on breast. Enc mom to relatch infant if pain does not diminish with latch or return later in the feeding. Reviewed that if pain is present with feeding, infant may not be transferring well. Discussed with mom it is ok to hand express and give infant dessert of EBM after BF if she was still acting hungry.   Mom was given manual pump with instructions for use and cleaning. Mom was given a # 21 flange to use with her manual pump. Mom to call insurance company to ask if they provide breast pump for their clients. BF Resources Handout and LC Brochure given, mom was informed of IP/OP Services, BF Support Groups and LC phone #. Enc mom to call with any questions/concerns as needed. Enc mom to bring infant to BF support Groups at least to follow weights in the first several weeks due to IVF status. Mom was offered OP appt and declined at this time. Reviewed LC phone # to call for questions/concerns or to call to make appt. Infant with follow up Ped appt tomorrow.  Enc mom to continue feeding infant 8-12 xin 24 hours at first feeding cues. BF basics, I/O, signs of engorgement in the infant, breast milk handling and storage and engorgement prevention/treatment reviewed with mom.  Mom reports BF assistance was helpful. Parents report they have no further questions/concerns at this time. She is aware to call with any questions/concerns.           Maternal Data Formula Feeding for Exclusion: No Has patient been taught Hand Expression?: Yes Does the patient have  breastfeeding experience prior to this delivery?: No  Feeding Feeding Type: Breast Fed Length of feed: 10 min  LATCH Score Latch: Repeated attempts needed to sustain latch, nipple held in mouth throughout feeding, stimulation needed to elicit sucking reflex.  Audible Swallowing: Spontaneous and intermittent  Type of Nipple: Everted at rest and after stimulation  Comfort (Breast/Nipple): Filling, red/small blisters or bruises, mild/mod discomfort  Hold (Positioning): Assistance needed to correctly position infant at breast and maintain latch.  LATCH Score: 7  Interventions Interventions: Breast feeding basics reviewed;Support pillows;Assisted with latch;Position options;Skin to skin;Expressed milk;Coconut oil;Comfort gels;Hand pump  Lactation Tools Discussed/Used Tools: Comfort gels WIC Program: No Pump Review: Milk Storage;Setup, frequency, and cleaning   Consult Status Consult Status: Complete Follow-up type: Call as needed    Ed Blalock 09/26/2017, 11:02 AM

## 2017-09-26 NOTE — Discharge Summary (Signed)
OB Discharge Summary     Patient Name: Evelyn Clark DOB: 1971-05-14 MRN: [id number]  Date of admission: 09/24/2017 Delivering MD: Gerrit HeckEMLY, JESSICA   Date of discharge: 09/26/2017  Admitting diagnosis: 40 wk induction Intrauterine pregnancy: 2935w2d     Secondary diagnosis:  Active Problems:   AMA (advanced maternal age) multigravida 35+   SVD (spontaneous vaginal delivery)   Laceration of vaginal wall or sulcus without perineal laceration during delivery   Obstetric labial laceration, delivered, current hospitalization  Additional problems: idiopathic thrombocytopenia     Discharge diagnosis: Term Pregnancy Delivered, Gestational Hypertension and Anemia                                                                                                Post partum procedures:None  Augmentation: AROM and Pitocin  Complications: None  Hospital course:  Induction of Labor With Vaginal Delivery   46 y.o. yo G2P1011 at 5635w2d was admitted to the hospital 09/24/2017 for induction of labor.  Indication for induction: Gestational hypertension and AMA.  Patient had an uncomplicated labor course as follows: Membrane Rupture Time/Date: 3:55 PM ,09/24/2017   Intrapartum Procedures: Episiotomy: None [1]                                         Lacerations:  Sulcus [9];Labial [10]  Patient had delivery of a Viable infant.  Information for the patient's newborn:  Howeth, Girl Dinita [[id number]][[id number]]  Delivery Method: Vaginal, Spontaneous Delivery (Filed from Delivery Summary)   09/24/2017  Details of delivery can be found in separate delivery note.  Patient had a routine postpartum course. Patient is discharged home 09/26/17.  Physical exam  Vitals:   09/25/17 0215 09/25/17 0634 09/25/17 1757 09/26/17 0519  BP: (!) 146/83 130/69 128/78 (!) 145/75  Pulse: 72 (!) 53 65 (!) 59  Resp: 18 18 18 18   Temp: 98.1 F (36.7 C) 98.2 F (36.8 C) 98.4 F (36.9 C) 98.3 F (36.8 C)  TempSrc: Oral  Oral Oral Oral  SpO2: 100%   100%  Weight:      Height:       General: alert, cooperative and no distress Lochia: appropriate Uterine Fundus: firm Incision: N/A DVT Evaluation: No evidence of DVT seen on physical exam. Negative Homan's sign. Calf/Ankle edema is present Labs: Lab Results  Component Value Date   WBC 14.2 (H) 09/26/2017   HGB 7.5 (L) 09/26/2017   HCT 22.2 (L) 09/26/2017   MCV 74.5 (L) 09/26/2017   PLT 126 (L) 09/26/2017   CMP Latest Ref Rng & Units 09/25/2017  Glucose 65 - 99 mg/dL 696(E137(H)  BUN 6 - 20 mg/dL 10  Creatinine 9.520.44 - 8.411.00 mg/dL 3.24(M1.19(H)  Sodium 010135 - 272145 mmol/L 135  Potassium 3.5 - 5.1 mmol/L 3.8  Chloride 101 - 111 mmol/L 105  CO2 22 - 32 mmol/L 23  Calcium 8.9 - 10.3 mg/dL 5.3(G8.5(L)  Total Protein 6.5 - 8.1 g/dL 5.2(L)  Total Bilirubin 0.3 - 1.2 mg/dL 0.7  Alkaline Phos  38 - 126 U/L 171(H)  AST 15 - 41 U/L 33  ALT 14 - 54 U/L 15    Discharge instruction: per After Visit Summary and "Baby and Me Booklet".  After visit meds:  Allergies as of 09/26/2017   No Known Allergies     Medication List    STOP taking these medications   calcium carbonate 500 MG chewable tablet Commonly known as:  TUMS - dosed in mg elemental calcium   Papaya Enzyme Chew     TAKE these medications   acetaminophen 500 MG tablet Commonly known as:  TYLENOL Take 1,000 mg by mouth every 6 (six) hours as needed for moderate pain or headache.   ferrous sulfate 325 (65 FE) MG EC tablet Take 1 tablet (325 mg total) by mouth 2 (two) times daily.   ibuprofen 600 MG tablet Commonly known as:  ADVIL,MOTRIN Take 1 tablet (600 mg total) by mouth every 6 (six) hours.   prenatal multivitamin Tabs tablet Take 1 tablet by mouth daily at 12 noon.   ranitidine 150 MG tablet Commonly known as:  ZANTAC Take 150 mg by mouth 2 (two) times daily.   Vitamin D3 5000 units Caps Take 5,000 Units by mouth daily.       Diet: routine diet  Activity: Advance as tolerated.  Pelvic rest for 6 weeks.   Outpatient follow up:6 weeks Follow up Appt:No future appointments. Follow up Visit:No Follow-up on file.  Postpartum contraception: None  Newborn Data: Live born female  Birth Weight: 6 lb 7.9 oz (2946 g) APGAR: 8, 9  Newborn Delivery   Birth date/time:  09/24/2017 22:32:00 Delivery type:  Vaginal, Spontaneous Delivery      Baby Feeding: Breast Disposition:home with mother   09/26/2017 Kenney Houseman, CNM

## 2017-10-06 ENCOUNTER — Encounter (HOSPITAL_COMMUNITY): Payer: Self-pay | Admitting: Radiology

## 2017-10-06 ENCOUNTER — Inpatient Hospital Stay (HOSPITAL_COMMUNITY)
Admission: AD | Admit: 2017-10-06 | Discharge: 2017-10-10 | DRG: 776 | Disposition: A | Discharge status: Home / Self Care | Payer: Managed Care, Other (non HMO) | Source: Ambulatory Visit | Attending: Obstetrics and Gynecology | Admitting: Obstetrics and Gynecology

## 2017-10-06 ENCOUNTER — Inpatient Hospital Stay (HOSPITAL_COMMUNITY): Payer: Managed Care, Other (non HMO)

## 2017-10-06 ENCOUNTER — Other Ambulatory Visit: Payer: Self-pay

## 2017-10-06 DIAGNOSIS — E871 Hypo-osmolality and hyponatremia: Secondary | ICD-10-CM | POA: Diagnosis present

## 2017-10-06 DIAGNOSIS — R740 Nonspecific elevation of levels of transaminase and lactic acid dehydrogenase [LDH]: Secondary | ICD-10-CM | POA: Diagnosis present

## 2017-10-06 DIAGNOSIS — R0602 Shortness of breath: Secondary | ICD-10-CM | POA: Diagnosis present

## 2017-10-06 DIAGNOSIS — O904 Postpartum acute kidney failure: Secondary | ICD-10-CM | POA: Diagnosis present

## 2017-10-06 DIAGNOSIS — R1011 Right upper quadrant pain: Secondary | ICD-10-CM

## 2017-10-06 DIAGNOSIS — O09513 Supervision of elderly primigravida, third trimester: Secondary | ICD-10-CM | POA: Diagnosis present

## 2017-10-06 DIAGNOSIS — I5023 Acute on chronic systolic (congestive) heart failure: Secondary | ICD-10-CM | POA: Diagnosis present

## 2017-10-06 DIAGNOSIS — R570 Cardiogenic shock: Secondary | ICD-10-CM | POA: Diagnosis not present

## 2017-10-06 DIAGNOSIS — I5021 Acute systolic (congestive) heart failure: Secondary | ICD-10-CM | POA: Diagnosis not present

## 2017-10-06 DIAGNOSIS — O903 Peripartum cardiomyopathy: Principal | ICD-10-CM | POA: Clinically undetermined

## 2017-10-06 DIAGNOSIS — O9279 Other disorders of lactation: Secondary | ICD-10-CM | POA: Diagnosis present

## 2017-10-06 DIAGNOSIS — O9089 Other complications of the puerperium, not elsewhere classified: Secondary | ICD-10-CM | POA: Diagnosis present

## 2017-10-06 DIAGNOSIS — K5900 Constipation, unspecified: Secondary | ICD-10-CM | POA: Diagnosis present

## 2017-10-06 DIAGNOSIS — O1495 Unspecified pre-eclampsia, complicating the puerperium: Secondary | ICD-10-CM

## 2017-10-06 DIAGNOSIS — R945 Abnormal results of liver function studies: Secondary | ICD-10-CM | POA: Diagnosis not present

## 2017-10-06 LAB — URINALYSIS, ROUTINE W REFLEX MICROSCOPIC
Glucose, UA: NEGATIVE mg/dL
KETONES UR: NEGATIVE mg/dL
Nitrite: NEGATIVE
Protein, ur: >=300 mg/dL — AB
Specific Gravity, Urine: 1.029 (ref 1.005–1.030)
pH: 5 (ref 5.0–8.0)

## 2017-10-06 LAB — CK TOTAL AND CKMB (NOT AT ARMC)
CK, MB: 4.3 ng/mL (ref 0.5–5.0)
Relative Index: INVALID (ref 0.0–2.5)
Total CK: 82 U/L (ref 38–234)

## 2017-10-06 LAB — CBC
HCT: 29.1 % — ABNORMAL LOW (ref 36.0–46.0)
Hemoglobin: 9.7 g/dL — ABNORMAL LOW (ref 12.0–15.0)
MCH: 25.7 pg — ABNORMAL LOW (ref 26.0–34.0)
MCHC: 33.3 g/dL (ref 30.0–36.0)
MCV: 77.2 fL — ABNORMAL LOW (ref 78.0–100.0)
Platelets: 343 10*3/uL (ref 150–400)
RBC: 3.77 MIL/uL — ABNORMAL LOW (ref 3.87–5.11)
RDW: 17.3 % — ABNORMAL HIGH (ref 11.5–15.5)
WBC: 13.3 10*3/uL — ABNORMAL HIGH (ref 4.0–10.5)

## 2017-10-06 LAB — D-DIMER, QUANTITATIVE: D-Dimer, Quant: 10.56 ug/mL-FEU — ABNORMAL HIGH (ref 0.00–0.50)

## 2017-10-06 LAB — COMPREHENSIVE METABOLIC PANEL
ALT: 64 U/L — ABNORMAL HIGH (ref 14–54)
AST: 69 U/L — ABNORMAL HIGH (ref 15–41)
Albumin: 3 g/dL — ABNORMAL LOW (ref 3.5–5.0)
Alkaline Phosphatase: 102 U/L (ref 38–126)
Anion gap: 12 (ref 5–15)
BUN: 14 mg/dL (ref 6–20)
CO2: 20 mmol/L — ABNORMAL LOW (ref 22–32)
Calcium: 8.3 mg/dL — ABNORMAL LOW (ref 8.9–10.3)
Chloride: 101 mmol/L (ref 101–111)
Creatinine, Ser: 0.84 mg/dL (ref 0.44–1.00)
GFR calc Af Amer: >60 mL/min (ref >60)
GFR calc non Af Amer: >60 mL/min (ref >60)
Glucose, Bld: 118 mg/dL — ABNORMAL HIGH (ref 65–99)
Potassium: 4.9 mmol/L (ref 3.5–5.1)
Sodium: 133 mmol/L — ABNORMAL LOW (ref 135–145)
Total Bilirubin: 0.8 mg/dL (ref 0.3–1.2)
Total Protein: 6.6 g/dL (ref 6.5–8.1)

## 2017-10-06 LAB — PROTEIN / CREATININE RATIO, URINE
Creatinine, Urine: 333 mg/dL
Creatinine, Urine: 50 mg/dL
Protein Creatinine Ratio: 0.97 mg/mg{Cre} — ABNORMAL HIGH (ref 0.00–0.15)
Protein Creatinine Ratio: 0.62 mg/mg{Cre} — ABNORMAL HIGH (ref 0.00–0.15)
Total Protein, Urine: 324 mg/dL
Total Protein, Urine: 31 mg/dL

## 2017-10-06 LAB — TROPONIN I: Troponin I: 0.21 ng/mL (ref <0.03)

## 2017-10-06 MED ORDER — MAGNESIUM SULFATE 40 G IN LACTATED RINGERS - SIMPLE
1.0000 g/h | INTRAVENOUS | Status: DC
  Administered 2017-10-06: 2 g/h via INTRAVENOUS
  Filled 2017-10-06 (×2): qty 500

## 2017-10-06 MED ORDER — HYDRALAZINE HCL 20 MG/ML IJ SOLN: 10.0000 mg | Freq: Once | INTRAMUSCULAR | Status: DC | PRN

## 2017-10-06 MED ORDER — PRENATAL MULTIVITAMIN CH
1.0000 | ORAL_TABLET | Freq: Every day | ORAL | Status: DC
  Administered 2017-10-07 – 2017-10-10 (×4): 1 via ORAL
  Filled 2017-10-06 (×4): qty 1

## 2017-10-06 MED ORDER — IBUPROFEN 600 MG PO TABS
600.0000 mg | ORAL_TABLET | Freq: Four times a day (QID) | ORAL | Status: DC
  Administered 2017-10-06 – 2017-10-07 (×3): 600 mg via ORAL
  Filled 2017-10-06 (×3): qty 1

## 2017-10-06 MED ORDER — LACTATED RINGERS IV SOLN
INTRAVENOUS | Status: DC
  Administered 2017-10-06: 125 mL/h via INTRAVENOUS
  Administered 2017-10-07: 12:00:00 via INTRAVENOUS

## 2017-10-06 MED ORDER — VITAMIN D 1000 UNITS PO TABS
5000.0000 [IU] | ORAL_TABLET | Freq: Every day | ORAL | Status: DC
  Administered 2017-10-07 – 2017-10-10 (×4): 5000 [IU] via ORAL
  Filled 2017-10-06 (×6): qty 5

## 2017-10-06 MED ORDER — FUROSEMIDE 10 MG/ML IJ SOLN
20.0000 mg | Freq: Once | INTRAMUSCULAR | Status: AC
  Administered 2017-10-06: 20 mg via INTRAVENOUS
  Filled 2017-10-06: qty 2

## 2017-10-06 MED ORDER — FAMOTIDINE IN NACL 20-0.9 MG/50ML-% IV SOLN
20.0000 mg | Freq: Once | INTRAVENOUS | Status: AC
Start: 2017-10-06 — End: 2017-10-06
  Administered 2017-10-06: 20 mg via INTRAVENOUS
  Filled 2017-10-06: qty 50

## 2017-10-06 MED ORDER — IOPAMIDOL (ISOVUE-370) INJECTION 76%
100.0000 mL | Freq: Once | INTRAVENOUS | Status: AC | PRN
  Administered 2017-10-06: 100 mL via INTRAVENOUS

## 2017-10-06 MED ORDER — MAGNESIUM SULFATE BOLUS VIA INFUSION
4.0000 g | Freq: Once | INTRAVENOUS | Status: AC
  Administered 2017-10-06: 4 g via INTRAVENOUS
  Filled 2017-10-06: qty 500

## 2017-10-06 MED ORDER — DOCUSATE SODIUM 100 MG PO CAPS
300.0000 mg | ORAL_CAPSULE | Freq: Every day | ORAL | Status: DC
  Administered 2017-10-08: 300 mg via ORAL
  Filled 2017-10-06 (×2): qty 3

## 2017-10-06 MED ORDER — LABETALOL HCL 5 MG/ML IV SOLN: 20.0000 mg | INTRAVENOUS | Status: DC | PRN

## 2017-10-06 MED ORDER — FERROUS SULFATE 325 (65 FE) MG PO TABS
325.0000 mg | ORAL_TABLET | Freq: Two times a day (BID) | ORAL | Status: DC
  Administered 2017-10-07 – 2017-10-10 (×6): 325 mg via ORAL
  Filled 2017-10-06 (×6): qty 1

## 2017-10-06 NOTE — MAU Note (Signed)
Vaginal delivery on 10/21, SOB for the last 2-3 days, felt like she couldn't breath last night.  C/O upper abd pain that wraps around to the sides, "feels sore & bruised."  Denies vomiting or diarrhea, does have loose stools.

## 2017-10-06 NOTE — MAU Note (Signed)
Pt. Back from radiology (CT & Abd. U/S). Pt. Grunting, with RR 24. Notified Dr. Sallye OberKulwa, O2 put in place, instructed to notify midwife. RN to notify CNM Clemmons (on call).

## 2017-10-06 NOTE — H&P (Signed)
CSN: [id number]  Arrival date & time 10/06/17  1129   First Provider Initiated Contact with Patient 10/06/17 1306         Chief Complaint  Patient presents with  . Shortness of Breath  . Abdominal Pain    Jannice Pollan 46 y.o. G2P1011 2 weeks postpartum; Delivered on 09/24/18 Vaginal delivery presents to the [facility] with complaint of shortness of breath espicially when lying down and pain around her upper abdominal area and ribs       Past Medical History:  Diagnosis Date  . Arthritis   . Hemoglobin E trait (HCC)   . Newborn product of in vitro fertilization (IVF) pregnancy   . Vitamin D deficiency     Past Surgical History:  Procedure Laterality Date  . WISDOM TOOTH EXTRACTION           Family History  Problem Relation Age of Onset  . Hypertension Mother   . Hyperlipidemia Mother   . Osteoporosis Mother   . Hypertension Father   . Breast cancer Maternal Aunt         Social History  Substance Use Topics  . Smoking status: Never Smoker  . Smokeless tobacco: Never Used  . Alcohol use Not on file            OB History    Gravida Para Term Preterm AB Living   2 1 1   1 1    SAB TAB Ectopic Multiple Live Births   1     0 1      Review of Systems  Respiratory: Positive for shortness of breath.   Cardiovascular: Positive for leg swelling.  Gastrointestinal: Positive for abdominal pain.  Musculoskeletal: Positive for myalgias.    Allergies  Patient has no known allergies.  Home Medications    BP (!) 127/95 (BP Location: Right Arm)   Pulse (!) 104   Temp 98.7 F (37.1 C) (Oral)   Resp 18   SpO2 98%   Physical Exam  Constitutional: She is oriented to person, place, and time. She appears well-developed and well-nourished. No distress.  HENT:  Head: Normocephalic and atraumatic.  Neck: Normal range of motion.  Cardiovascular: Regular rhythm and normal heart sounds.   HR 104- Tachycardia  Pulmonary/Chest:  Effort normal and breath sounds normal. No respiratory distress. She has no wheezes. She has no rales. She exhibits no tenderness.  Abdominal: Soft. She exhibits distension. There is no tenderness.  Musculoskeletal: Normal range of motion.  Neurological: She is alert and oriented to person, place, and time.  Skin: Skin is warm and dry. She is not diaphoretic.  Psychiatric: She has a normal mood and affect. Her behavior is normal. Thought content normal.  Nursing note and vitals reviewed.   MAU Course  Procedures (including critical care time)       Labs Reviewed  URINALYSIS, ROUTINE W REFLEX MICROSCOPIC - Abnormal; Notable for the following:       Result Value    Color, Urine AMBER (*)    APPearance CLOUDY (*)    Hgb urine dipstick LARGE (*)    Bilirubin Urine SMALL (*)    Protein, ur >=300 (*)    Leukocytes, UA MODERATE (*)    Bacteria, UA RARE (*)    Squamous Epithelial / LPF 6-30 (*)    All other components within normal limits  D-DIMER, QUANTITATIVE (NOT AT [facility]) - Abnormal; Notable for the following:    D-Dimer, Quant 10.56 (*)    All other  components within normal limits  CBC - Abnormal; Notable for the following:    WBC 13.3 (*)    RBC 3.77 (*)    Hemoglobin 9.7 (*)    HCT 29.1 (*)    MCV 77.2 (*)    MCH 25.7 (*)    RDW 17.3 (*)    All other components within normal limits  COMPREHENSIVE METABOLIC PANEL - Abnormal; Notable for the following:    Sodium 133 (*)    CO2 20 (*)    Glucose, Bld 118 (*)    Calcium 8.3 (*)    Albumin 3.0 (*)    AST 69 (*)    ALT 64 (*)    All other components within normal limits  PROTEIN / CREATININE RATIO, URINE    Imaging Results (Last 48 hours)  Ct Angio Chest Pe W Or Wo Contrast  Result Date: 10/06/2017 CLINICAL DATA:  Shortness of breath and epigastric pain for the past 4 days. Patient status post partum on 09/24/2017. EXAM: CT ANGIOGRAPHY CHEST WITH CONTRAST TECHNIQUE:  Multidetector CT imaging of the chest was performed using the standard protocol during bolus administration of intravenous contrast. Multiplanar CT image reconstructions and MIPs were obtained to evaluate the vascular anatomy. CONTRAST:  100 cc Isovue 370. COMPARISON:  None. FINDINGS: Cardiovascular: No pulmonary embolus is identified. The pulmonary arteries are well opacified. There is cardiomegaly. No pericardial effusion. Mediastinum/Nodes: No enlarged mediastinal, hilar, or axillary lymph nodes. Thyroid gland, trachea, and esophagus demonstrate no significant findings. Lungs/Pleura: Small to moderate bilateral pleural effusions are present, larger on the right. The lungs demonstrate only mild dependent atelectatic change. Upper Abdomen: Negative. Musculoskeletal: Negative. Review of the MIP images confirms the above findings. IMPRESSION: Negative for pulmonary embolus. Small to moderate bilateral pleural effusions, larger on the right. Cardiomegaly without evidence of pulmonary edema. Electronically Signed   By: Drusilla Kanner M.D.   On: 10/06/2017 14:30    Lab Results Last 24 Hours       Results for orders placed or performed during the hospital encounter of 10/06/17 (from the past 24 hour(s))  Urinalysis, Routine w reflex microscopic     Status: Abnormal   Collection Time: 10/06/17 11:46 AM  Result Value Ref Range   Color, Urine AMBER (A) YELLOW   APPearance CLOUDY (A) CLEAR   Specific Gravity, Urine 1.029 1.005 - 1.030   pH 5.0 5.0 - 8.0   Glucose, UA NEGATIVE NEGATIVE mg/dL   Hgb urine dipstick LARGE (A) NEGATIVE   Bilirubin Urine SMALL (A) NEGATIVE   Ketones, ur NEGATIVE NEGATIVE mg/dL   Protein, ur >=130 (A) NEGATIVE mg/dL   Nitrite NEGATIVE NEGATIVE   Leukocytes, UA MODERATE (A) NEGATIVE   RBC / HPF TOO NUMEROUS TO COUNT 0 - 5 RBC/hpf   WBC, UA TOO NUMEROUS TO COUNT 0 - 5 WBC/hpf   Bacteria, UA RARE (A) NONE SEEN   Squamous Epithelial / LPF 6-30 (A) NONE SEEN    Mucus PRESENT    Hyaline Casts, UA PRESENT   Protein / creatinine ratio, urine     Status: Abnormal   Collection Time: 10/06/17 11:46 AM  Result Value Ref Range   Creatinine, Urine 333.00 mg/dL   Total Protein, Urine 324 mg/dL   Protein Creatinine Ratio 0.97 (H) 0.00 - 0.15 mg/mg[Cre]  D-dimer, quantitative (not at [facility])     Status: Abnormal   Collection Time: 10/06/17  1:39 PM  Result Value Ref Range   D-Dimer, Quant 10.56 (H) 0.00 - 0.50 ug/mL-FEU  CBC     Status: Abnormal   Collection Time: 10/06/17  1:39 PM  Result Value Ref Range   WBC 13.3 (H) 4.0 - 10.5 K/uL   RBC 3.77 (L) 3.87 - 5.11 MIL/uL   Hemoglobin 9.7 (L) 12.0 - 15.0 g/dL   HCT 16.1 (L) 09.6 - 04.5 %   MCV 77.2 (L) 78.0 - 100.0 fL   MCH 25.7 (L) 26.0 - 34.0 pg   MCHC 33.3 30.0 - 36.0 g/dL   RDW 40.9 (H) 81.1 - 91.4 %   Platelets 343 150 - 400 K/uL  Comprehensive metabolic panel     Status: Abnormal   Collection Time: 10/06/17  1:39 PM  Result Value Ref Range   Sodium 133 (L) 135 - 145 mmol/L   Potassium 4.9 3.5 - 5.1 mmol/L   Chloride 101 101 - 111 mmol/L   CO2 20 (L) 22 - 32 mmol/L   Glucose, Bld 118 (H) 65 - 99 mg/dL   BUN 14 6 - 20 mg/dL   Creatinine, Ser 7.82 0.44 - 1.00 mg/dL   Calcium 8.3 (L) 8.9 - 10.3 mg/dL   Total Protein 6.6 6.5 - 8.1 g/dL   Albumin 3.0 (L) 3.5 - 5.0 g/dL   AST 69 (H) 15 - 41 U/L   ALT 64 (H) 14 - 54 U/L   Alkaline Phosphatase 102 38 - 126 U/L   Total Bilirubin 0.8 0.3 - 1.2 mg/dL   GFR calc non Af Amer >60 >60 mL/min   GFR calc Af Amer >60 >60 mL/min   Anion gap 12 5 - 15      1. RUQ pain   2. SOB 3. Bilateral Upper Abdominal pain     MDM  Cmp reveals elevated liver enzymes; pending cath pcr/ Chest CT shows no PE but D-Dimer is elevated at 10.56. abdominal ultrasound results are negative. PCR elevated 0.9 POC discussed with Dr Sallye Ober. Pt will be admitted to 3rd floor for 24 hour Magnesium sulfate A 1. Postpartum          Preeclampsia         Elevated Liver Enzymes P1. Admit to 3rd floor       Magnesium Sulfate x 24 hours Illene Bolus CNM 10/06/17 @ 457pm

## 2017-10-06 NOTE — MAU Provider Note (Signed)
History     CSN: [id number]  Arrival date & time 10/06/17  1129   First Provider Initiated Contact with Patient 10/06/17 1306      Chief Complaint  Patient presents with  . Shortness of Breath  . Abdominal Pain    Evelyn Clark 46 y.o. G2P1011 2 weeks postpartum; Delivered on 09/24/18 Vaginal delivery presents to the [facility] with complaint of shortness of breath espicially when lying down and pain around her upper abdominal area and ribs    Past Medical History:  Diagnosis Date  . Arthritis   . Hemoglobin E trait (HCC)   . Newborn product of in vitro fertilization (IVF) pregnancy   . Vitamin D deficiency     Past Surgical History:  Procedure Laterality Date  . WISDOM TOOTH EXTRACTION      Family History  Problem Relation Age of Onset  . Hypertension Mother   . Hyperlipidemia Mother   . Osteoporosis Mother   . Hypertension Father   . Breast cancer Maternal Aunt     Social History  Substance Use Topics  . Smoking status: Never Smoker  . Smokeless tobacco: Never Used  . Alcohol use Not on file    OB History    Gravida Para Term Preterm AB Living   2 1 1   1 1    SAB TAB Ectopic Multiple Live Births   1     0 1      Review of Systems  Respiratory: Positive for shortness of breath.   Cardiovascular: Positive for leg swelling.  Gastrointestinal: Positive for abdominal pain.  Musculoskeletal: Positive for myalgias.    Allergies  Patient has no known allergies.  Home Medications    BP (!) 127/95 (BP Location: Right Arm)   Pulse (!) 104   Temp 98.7 F (37.1 C) (Oral)   Resp 18   SpO2 98%   Physical Exam  Constitutional: She is oriented to person, place, and time. She appears well-developed and well-nourished. No distress.  HENT:  Head: Normocephalic and atraumatic.  Neck: Normal range of motion.  Cardiovascular: Regular rhythm and normal heart sounds.   HR 104- Tachycardia  Pulmonary/Chest: Effort normal and breath sounds normal. No  respiratory distress. She has no wheezes. She has no rales. She exhibits no tenderness.  Abdominal: Soft. She exhibits distension. There is no tenderness.  Musculoskeletal: Normal range of motion.  Neurological: She is alert and oriented to person, place, and time.  Skin: Skin is warm and dry. She is not diaphoretic.  Psychiatric: She has a normal mood and affect. Her behavior is normal. Thought content normal.  Nursing note and vitals reviewed.   MAU Course  Procedures (including critical care time)  Labs Reviewed  URINALYSIS, ROUTINE W REFLEX MICROSCOPIC - Abnormal; Notable for the following:       Result Value   Color, Urine AMBER (*)    APPearance CLOUDY (*)    Hgb urine dipstick LARGE (*)    Bilirubin Urine SMALL (*)    Protein, ur >=300 (*)    Leukocytes, UA MODERATE (*)    Bacteria, UA RARE (*)    Squamous Epithelial / LPF 6-30 (*)    All other components within normal limits  D-DIMER, QUANTITATIVE (NOT AT [facility]) - Abnormal; Notable for the following:    D-Dimer, Quant 10.56 (*)    All other components within normal limits  CBC - Abnormal; Notable for the following:    WBC 13.3 (*)    RBC 3.77 (*)  Hemoglobin 9.7 (*)    HCT 29.1 (*)    MCV 77.2 (*)    MCH 25.7 (*)    RDW 17.3 (*)    All other components within normal limits  COMPREHENSIVE METABOLIC PANEL - Abnormal; Notable for the following:    Sodium 133 (*)    CO2 20 (*)    Glucose, Bld 118 (*)    Calcium 8.3 (*)    Albumin 3.0 (*)    AST 69 (*)    ALT 64 (*)    All other components within normal limits  PROTEIN / CREATININE RATIO, URINE   Ct Angio Chest Pe W Or Wo Contrast  Result Date: 10/06/2017 CLINICAL DATA:  Shortness of breath and epigastric pain for the past 4 days. Patient status post partum on 09/24/2017. EXAM: CT ANGIOGRAPHY CHEST WITH CONTRAST TECHNIQUE: Multidetector CT imaging of the chest was performed using the standard protocol during bolus administration of intravenous contrast.  Multiplanar CT image reconstructions and MIPs were obtained to evaluate the vascular anatomy. CONTRAST:  100 cc Isovue 370. COMPARISON:  None. FINDINGS: Cardiovascular: No pulmonary embolus is identified. The pulmonary arteries are well opacified. There is cardiomegaly. No pericardial effusion. Mediastinum/Nodes: No enlarged mediastinal, hilar, or axillary lymph nodes. Thyroid gland, trachea, and esophagus demonstrate no significant findings. Lungs/Pleura: Small to moderate bilateral pleural effusions are present, larger on the right. The lungs demonstrate only mild dependent atelectatic change. Upper Abdomen: Negative. Musculoskeletal: Negative. Review of the MIP images confirms the above findings. IMPRESSION: Negative for pulmonary embolus. Small to moderate bilateral pleural effusions, larger on the right. Cardiomegaly without evidence of pulmonary edema. Electronically Signed   By: Drusilla Kannerhomas  Dalessio M.D.   On: 10/06/2017 14:30   Results for orders placed or performed during the hospital encounter of 10/06/17 (from the past 24 hour(s))  Urinalysis, Routine w reflex microscopic     Status: Abnormal   Collection Time: 10/06/17 11:46 AM  Result Value Ref Range   Color, Urine AMBER (A) YELLOW   APPearance CLOUDY (A) CLEAR   Specific Gravity, Urine 1.029 1.005 - 1.030   pH 5.0 5.0 - 8.0   Glucose, UA NEGATIVE NEGATIVE mg/dL   Hgb urine dipstick LARGE (A) NEGATIVE   Bilirubin Urine SMALL (A) NEGATIVE   Ketones, ur NEGATIVE NEGATIVE mg/dL   Protein, ur >=161>=300 (A) NEGATIVE mg/dL   Nitrite NEGATIVE NEGATIVE   Leukocytes, UA MODERATE (A) NEGATIVE   RBC / HPF TOO NUMEROUS TO COUNT 0 - 5 RBC/hpf   WBC, UA TOO NUMEROUS TO COUNT 0 - 5 WBC/hpf   Bacteria, UA RARE (A) NONE SEEN   Squamous Epithelial / LPF 6-30 (A) NONE SEEN   Mucus PRESENT    Hyaline Casts, UA PRESENT   Protein / creatinine ratio, urine     Status: Abnormal   Collection Time: 10/06/17 11:46 AM  Result Value Ref Range   Creatinine, Urine  333.00 mg/dL   Total Protein, Urine 324 mg/dL   Protein Creatinine Ratio 0.97 (H) 0.00 - 0.15 mg/mg[Cre]  D-dimer, quantitative (not at [facility])     Status: Abnormal   Collection Time: 10/06/17  1:39 PM  Result Value Ref Range   D-Dimer, Quant 10.56 (H) 0.00 - 0.50 ug/mL-FEU  CBC     Status: Abnormal   Collection Time: 10/06/17  1:39 PM  Result Value Ref Range   WBC 13.3 (H) 4.0 - 10.5 K/uL   RBC 3.77 (L) 3.87 - 5.11 MIL/uL   Hemoglobin 9.7 (L) 12.0 - 15.0  g/dL   HCT 16.1 (L) 09.6 - 04.5 %   MCV 77.2 (L) 78.0 - 100.0 fL   MCH 25.7 (L) 26.0 - 34.0 pg   MCHC 33.3 30.0 - 36.0 g/dL   RDW 40.9 (H) 81.1 - 91.4 %   Platelets 343 150 - 400 K/uL  Comprehensive metabolic panel     Status: Abnormal   Collection Time: 10/06/17  1:39 PM  Result Value Ref Range   Sodium 133 (L) 135 - 145 mmol/L   Potassium 4.9 3.5 - 5.1 mmol/L   Chloride 101 101 - 111 mmol/L   CO2 20 (L) 22 - 32 mmol/L   Glucose, Bld 118 (H) 65 - 99 mg/dL   BUN 14 6 - 20 mg/dL   Creatinine, Ser 7.82 0.44 - 1.00 mg/dL   Calcium 8.3 (L) 8.9 - 10.3 mg/dL   Total Protein 6.6 6.5 - 8.1 g/dL   Albumin 3.0 (L) 3.5 - 5.0 g/dL   AST 69 (H) 15 - 41 U/L   ALT 64 (H) 14 - 54 U/L   Alkaline Phosphatase 102 38 - 126 U/L   Total Bilirubin 0.8 0.3 - 1.2 mg/dL   GFR calc non Af Amer >60 >60 mL/min   GFR calc Af Amer >60 >60 mL/min   Anion gap 12 5 - 15    1. RUQ pain   2. SOB 3. Bilateral Upper Abdominal pain     MDM  Cmp reveals elevated liver enzymes; pending cath pcr/ Chest CT shows no PE but D-Dimer is elevated at 10.56. abdominal ultrasound results are negative. PCR elevated 0.9 POC discussed with Dr Sallye Ober. Pt will be admitted to 3rd floor for 24 hour Magnesium sulfate A 1. Postpartum         Preeclampsia         Elevated Liver Enzymes P1. Admit to 3rd floor       Magnesium Sulfate x 24 hours Illene Bolus CNM 10/06/17 @ 457pm

## 2017-10-06 NOTE — Progress Notes (Signed)
Subjective: Pt is feeling better.  States SOB has decreased.  Complains of heartburn.     Objective: Vital signs in last 24 hours: Temp:  [97.6 F (36.4 C)-98.7 F (37.1 C)] 98.3 F (36.8 C) (11/02 2200) Pulse Rate:  [90-104] 94 (11/02 2200) Resp:  [14-24] 18 (11/02 2200) BP: (102-127)/(80-95) 113/87 (11/02 2200) SpO2:  [94 %-100 %] 96 % (11/02 2200)  Physical Exam:  General: alert, cooperative and no distress Denies headache or blurred vision Heart RRR Lungs small crackles noted in bases Abd soft  Perineum healing Legs +3 pitting edema to knees.   CBC Latest Ref Rng & Units 10/06/2017 09/26/2017 09/25/2017  WBC 4.0 - 10.5 K/uL 13.3(H) 14.2(H) 15.4(H)  Hemoglobin 12.0 - 15.0 g/dL 4.4(I) 7.5(L) 9.3(L)  Hematocrit 36.0 - 46.0 % 29.1(L) 22.2(L) 26.9(L)  Platelets 150 - 400 K/uL 343 126(L) 136(L)     Result Value    Color, Urine AMBER (*)    APPearance CLOUDY (*)    Hgb urine dipstick LARGE (*)    Bilirubin Urine SMALL (*)    Protein, ur >=300 (*)    Leukocytes, UA MODERATE (*)    Bacteria, UA RARE (*)    Squamous Epithelial / LPF 6-30 (*)    All other components within normal limits  D-DIMER, QUANTITATIVE (NOT AT [facility]) - Abnormal; Notable for the following:    D-Dimer, Quant 10.56 (*)    All other components within normal limits  CBC - Abnormal; Notable for the following:    WBC 13.3 (*)    RBC 3.77 (*)    Hemoglobin 9.7 (*)    HCT 29.1 (*)    MCV 77.2 (*)    MCH 25.7 (*)    RDW 17.3 (*)    All other components within normal limits  COMPREHENSIVE METABOLIC PANEL - Abnormal; Notable for the following:    Sodium 133 (*)    CO2 20 (*)    Glucose, Bld 118 (*)    Calcium 8.3 (*)    Albumin 3.0 (*)    AST 69 (*)    ALT 64 (*)    All other components within normal limits  PROTEIN / CREATININE RATIO, URINE    Imaging Results (Last 48 hours)  Ct Angio Chest Pe W Or Wo Contrast  Result Date:  10/06/2017 CLINICAL DATA: Shortness of breath and epigastric pain for the past 4 days. Patient status post partum on 09/24/2017. EXAM: CT ANGIOGRAPHY CHEST WITH CONTRAST TECHNIQUE: Multidetector CT imaging of the chest was performed using the standard protocol during bolus administration of intravenous contrast. Multiplanar CT image reconstructions and MIPs were obtained to evaluate the vascular anatomy. CONTRAST: 100 cc Isovue 370. COMPARISON: None. FINDINGS: Cardiovascular: No pulmonary embolus is identified. The pulmonary arteries are well opacified. There is cardiomegaly. No pericardial effusion. Mediastinum/Nodes: No enlarged mediastinal, hilar, or axillary lymph nodes. Thyroid gland, trachea, and esophagus demonstrate no significant findings. Lungs/Pleura: Small to moderate bilateral pleural effusions are present, larger on the right. The lungs demonstrate only mild dependent atelectatic change. Upper Abdomen: Negative. Musculoskeletal: Negative. Review of the MIP images confirms the above findings. IMPRESSION: Negative for pulmonary embolus. Small to moderate bilateral pleural effusions, larger on the right. Cardiomegaly without evidence of pulmonary edema. Electronically Signed By: Drusilla Kanner M.D. On: 10/06/2017 14:30    Lab Results Last 24 Hours       Results for orders placed or performed during the hospital encounter of 10/06/17 (from the past 24 hour(s))  Urinalysis, Routine w reflex  microscopic Status: Abnormal   Collection Time: 10/06/17 11:46 AM  Result Value Ref Range   Color, Urine AMBER (A) YELLOW   APPearance CLOUDY (A) CLEAR   Specific Gravity, Urine 1.029 1.005 - 1.030   pH 5.0 5.0 - 8.0   Glucose, UA NEGATIVE NEGATIVE mg/dL   Hgb urine dipstick LARGE (A) NEGATIVE   Bilirubin Urine SMALL (A) NEGATIVE   Ketones, ur NEGATIVE NEGATIVE mg/dL   Protein, ur >=562>=300 (A) NEGATIVE mg/dL   Nitrite NEGATIVE NEGATIVE   Leukocytes, UA MODERATE (A) NEGATIVE    RBC / HPF TOO NUMEROUS TO COUNT 0 - 5 RBC/hpf   WBC, UA TOO NUMEROUS TO COUNT 0 - 5 WBC/hpf   Bacteria, UA RARE (A) NONE SEEN   Squamous Epithelial / LPF 6-30 (A) NONE SEEN   Mucus PRESENT    Hyaline Casts, UA PRESENT   Protein / creatinine ratio, urine Status: Abnormal   Collection Time: 10/06/17 11:46 AM  Result Value Ref Range   Creatinine, Urine 333.00 mg/dL   Total Protein, Urine 324 mg/dL   Protein Creatinine Ratio 0.97 (H) 0.00 - 0.15 mg/mg[Cre]  D-dimer, quantitative (not at [facility]) Status: Abnormal   Collection Time: 10/06/17 1:39 PM  Result Value Ref Range   D-Dimer, Quant 10.56 (H) 0.00 - 0.50 ug/mL-FEU  CBC Status: Abnormal   Collection Time: 10/06/17 1:39 PM  Result Value Ref Range   WBC 13.3 (H) 4.0 - 10.5 K/uL   RBC 3.77 (L) 3.87 - 5.11 MIL/uL   Hemoglobin 9.7 (L) 12.0 - 15.0 g/dL   HCT 13.029.1 (L) 86.536.0 - 78.446.0 %   MCV 77.2 (L) 78.0 - 100.0 fL   MCH 25.7 (L) 26.0 - 34.0 pg   MCHC 33.3 30.0 - 36.0 g/dL   RDW 69.617.3 (H) 29.511.5 - 28.415.5 %   Platelets 343 150 - 400 K/uL  Comprehensive metabolic panel Status: Abnormal   Collection Time: 10/06/17 1:39 PM  Result Value Ref Range   Sodium 133 (L) 135 - 145 mmol/L   Potassium 4.9 3.5 - 5.1 mmol/L   Chloride 101 101 - 111 mmol/L   CO2 20 (L) 22 - 32 mmol/L   Glucose, Bld 118 (H) 65 - 99 mg/dL   BUN 14 6 - 20 mg/dL   Creatinine, Ser 1.320.84 0.44 - 1.00 mg/dL   Calcium 8.3 (L) 8.9 - 10.3 mg/dL   Total Protein 6.6 6.5 - 8.1 g/dL   Albumin 3.0 (L) 3.5 - 5.0 g/dL   AST 69 (H) 15 - 41 U/L   ALT 64 (H) 14 - 54 U/L   Alkaline Phosphatase 102 38 - 126 U/L   Total Bilirubin 0.8 0.3 - 1.2 mg/dL   GFR calc non Af Amer >60 >60 mL/min   GFR calc Af Amer >60 >60 mL/min   Anion gap 12 5 - 15          CK 82 CK MB 4.3  Troponin .21   Assessment/Plan: Postpartum Pre eclampsia Consult DR. Varnardo Lasix 40 meq now Cardiology consult if needed in am   Henderson Newcomerancy Jean  ProtheroCNM 10/06/2017, 10:30 PM

## 2017-10-06 NOTE — Progress Notes (Signed)
CRITICAL VALUE ALERT  Critical Value: Troponin 0.21  Date & Time Notied:  10/06/17- 1825  Provider Notified: Illene BolusLori Clemmons, CNM  Orders Received/Actions taken: no new orders

## 2017-10-06 NOTE — MAU Note (Signed)
RN called CNM, notified of grunting and IV fluids started (per Dr. Sallye OberKulwa), orders received. Pt. Straight cath for PCR, sent to lab.  Patient tolerated procedure well. O2 at 4L remains in place with 100%, pt. States she feels better with O2 in place. No grunting heard.

## 2017-10-07 ENCOUNTER — Inpatient Hospital Stay (HOSPITAL_COMMUNITY): Payer: Managed Care, Other (non HMO)

## 2017-10-07 DIAGNOSIS — O1495 Unspecified pre-eclampsia, complicating the puerperium: Secondary | ICD-10-CM | POA: Diagnosis present

## 2017-10-07 DIAGNOSIS — R570 Cardiogenic shock: Secondary | ICD-10-CM

## 2017-10-07 DIAGNOSIS — R945 Abnormal results of liver function studies: Secondary | ICD-10-CM

## 2017-10-07 DIAGNOSIS — O09513 Supervision of elderly primigravida, third trimester: Secondary | ICD-10-CM | POA: Diagnosis present

## 2017-10-07 DIAGNOSIS — O903 Peripartum cardiomyopathy: Secondary | ICD-10-CM | POA: Clinically undetermined

## 2017-10-07 LAB — COMPREHENSIVE METABOLIC PANEL
ALK PHOS: 100 U/L (ref 38–126)
ALT: 70 U/L — AB (ref 14–54)
AST: 65 U/L — ABNORMAL HIGH (ref 15–41)
Albumin: 3.1 g/dL — ABNORMAL LOW (ref 3.5–5.0)
Anion gap: 10 (ref 5–15)
BILIRUBIN TOTAL: 0.6 mg/dL (ref 0.3–1.2)
BUN: 13 mg/dL (ref 6–20)
CALCIUM: 6.6 mg/dL — AB (ref 8.9–10.3)
CO2: 21 mmol/L — ABNORMAL LOW (ref 22–32)
CREATININE: 0.82 mg/dL (ref 0.44–1.00)
Chloride: 96 mmol/L — ABNORMAL LOW (ref 101–111)
Glucose, Bld: 115 mg/dL — ABNORMAL HIGH (ref 65–99)
Potassium: 4.3 mmol/L (ref 3.5–5.1)
Sodium: 127 mmol/L — ABNORMAL LOW (ref 135–145)
TOTAL PROTEIN: 6.5 g/dL (ref 6.5–8.1)

## 2017-10-07 LAB — CBC
HCT: 27.8 % — ABNORMAL LOW (ref 36.0–46.0)
Hemoglobin: 9.4 g/dL — ABNORMAL LOW (ref 12.0–15.0)
MCH: 25.8 pg — AB (ref 26.0–34.0)
MCHC: 33.8 g/dL (ref 30.0–36.0)
MCV: 76.2 fL — ABNORMAL LOW (ref 78.0–100.0)
PLATELETS: 318 10*3/uL (ref 150–400)
RBC: 3.65 MIL/uL — AB (ref 3.87–5.11)
RDW: 17.4 % — ABNORMAL HIGH (ref 11.5–15.5)
WBC: 11.8 10*3/uL — AB (ref 4.0–10.5)

## 2017-10-07 LAB — BRAIN NATRIURETIC PEPTIDE: B Natriuretic Peptide: 2911.8 pg/mL — ABNORMAL HIGH (ref 0.0–100.0)

## 2017-10-07 LAB — ECHOCARDIOGRAM COMPLETE

## 2017-10-07 LAB — MAGNESIUM: MAGNESIUM: 7.4 mg/dL — AB (ref 1.7–2.4)

## 2017-10-07 MED ORDER — SOD CITRATE-CITRIC ACID 500-334 MG/5ML PO SOLN: 30.0000 mL | ORAL | Status: DC | PRN

## 2017-10-07 MED ORDER — OXYTOCIN 40 UNITS IN LACTATED RINGERS INFUSION - SIMPLE MED: 1.0000 m[IU]/min | INTRAVENOUS | Status: DC

## 2017-10-07 MED ORDER — LIDOCAINE HCL (PF) 1 % IJ SOLN
30.0000 mL | INTRAMUSCULAR | Status: DC | PRN
  Filled 2017-10-07: qty 30

## 2017-10-07 MED ORDER — ACETAMINOPHEN 325 MG PO TABS: 650.0000 mg | ORAL_TABLET | ORAL | Status: DC | PRN

## 2017-10-07 MED ORDER — POTASSIUM CHLORIDE CRYS ER 10 MEQ PO TBCR
20.0000 meq | EXTENDED_RELEASE_TABLET | Freq: Two times a day (BID) | ORAL | Status: DC
  Administered 2017-10-07 – 2017-10-10 (×6): 20 meq via ORAL
  Filled 2017-10-07 (×9): qty 2

## 2017-10-07 MED ORDER — TERBUTALINE SULFATE 1 MG/ML IJ SOLN: 0.2500 mg | Freq: Once | INTRAMUSCULAR | Status: DC | PRN

## 2017-10-07 MED ORDER — PANTOPRAZOLE SODIUM 40 MG IV SOLR
40.0000 mg | Freq: Once | INTRAVENOUS | Status: AC
  Administered 2017-10-07: 40 mg via INTRAVENOUS
  Filled 2017-10-07: qty 40

## 2017-10-07 MED ORDER — ONDANSETRON HCL 4 MG/2ML IJ SOLN: 4.0000 mg | Freq: Four times a day (QID) | INTRAMUSCULAR | Status: DC | PRN

## 2017-10-07 MED ORDER — FUROSEMIDE 10 MG/ML IJ SOLN
40.0000 mg | Freq: Two times a day (BID) | INTRAMUSCULAR | Status: DC
  Filled 2017-10-07 (×2): qty 4

## 2017-10-07 MED ORDER — SPIRONOLACTONE 25 MG PO TABS: 25.0000 mg | ORAL_TABLET | Freq: Every day | ORAL | Status: DC

## 2017-10-07 MED ORDER — FUROSEMIDE 10 MG/ML IJ SOLN
40.0000 mg | Freq: Three times a day (TID) | INTRAMUSCULAR | Status: DC
  Administered 2017-10-08 – 2017-10-09 (×4): 40 mg via INTRAVENOUS
  Filled 2017-10-07 (×4): qty 4

## 2017-10-07 MED ORDER — LACTATED RINGERS IV SOLN: INTRAVENOUS | Status: DC

## 2017-10-07 MED ORDER — SPIRONOLACTONE 25 MG PO TABS
12.5000 mg | ORAL_TABLET | Freq: Every day | ORAL | Status: DC
  Administered 2017-10-07 – 2017-10-10 (×4): 12.5 mg via ORAL
  Filled 2017-10-07 (×5): qty 1

## 2017-10-07 MED ORDER — FUROSEMIDE 10 MG/ML IJ SOLN
40.0000 mg | Freq: Four times a day (QID) | INTRAMUSCULAR | Status: DC
Start: 2017-10-07 — End: 2017-10-07
  Administered 2017-10-07: 40 mg via INTRAVENOUS

## 2017-10-07 MED ORDER — MISOPROSTOL 25 MCG QUARTER TABLET
25.0000 ug | ORAL_TABLET | ORAL | Status: DC | PRN
  Filled 2017-10-07: qty 1

## 2017-10-07 MED ORDER — DIGOXIN 125 MCG PO TABS
0.1250 mg | ORAL_TABLET | Freq: Every day | ORAL | Status: DC
  Administered 2017-10-07 – 2017-10-10 (×4): 0.125 mg via ORAL
  Filled 2017-10-07 (×6): qty 1

## 2017-10-07 MED ORDER — ACETAMINOPHEN 325 MG PO TABS
650.0000 mg | ORAL_TABLET | Freq: Four times a day (QID) | ORAL | Status: DC | PRN
  Administered 2017-10-08: 650 mg via ORAL
  Filled 2017-10-07: qty 2

## 2017-10-07 MED ORDER — OXYTOCIN BOLUS FROM INFUSION: 500.0000 mL | Freq: Once | INTRAVENOUS | Status: DC

## 2017-10-07 MED ORDER — FUROSEMIDE 10 MG/ML IJ SOLN
40.0000 mg | Freq: Once | INTRAMUSCULAR | Status: AC
  Administered 2017-10-07: 40 mg via INTRAVENOUS
  Filled 2017-10-07: qty 4

## 2017-10-07 MED ORDER — LACTATED RINGERS IV SOLN: 500.0000 mL | INTRAVENOUS | Status: DC | PRN

## 2017-10-07 MED ORDER — FLEET ENEMA 7-19 GM/118ML RE ENEM: 1.0000 | ENEMA | RECTAL | Status: DC | PRN

## 2017-10-07 MED ORDER — ENALAPRIL MALEATE 2.5 MG PO TABS
2.5000 mg | ORAL_TABLET | Freq: Two times a day (BID) | ORAL | Status: DC
  Administered 2017-10-07 – 2017-10-08 (×2): 2.5 mg via ORAL
  Filled 2017-10-07 (×2): qty 1

## 2017-10-07 MED ORDER — FENTANYL CITRATE (PF) 100 MCG/2ML IJ SOLN: 50.0000 ug | INTRAMUSCULAR | Status: DC | PRN

## 2017-10-07 MED ORDER — OXYTOCIN 40 UNITS IN LACTATED RINGERS INFUSION - SIMPLE MED: 2.5000 [IU]/h | INTRAVENOUS | Status: DC

## 2017-10-07 MED ORDER — BISACODYL 10 MG RE SUPP: 10.0000 mg | Freq: Every day | RECTAL | Status: DC | PRN

## 2017-10-07 NOTE — Consult Note (Signed)
Advanced Heart Failure Team Consult Note   Refering Physician: Dr. 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 Delton See   [name] is a very pleasant female with no prior medical history of heart disease, who is 2 weeks postpartum via vaginal delivery. She conceived with IVF using a donor egg. She had an uncomplicated pregnancy (excerpt for severe GERD) with no unusual cardiac symptoms. She was active previously without problems.   She delivered a healthy baby girl Evelyn Clark on 09/25/2017 via vaginal delivery. She was discharged with some lower extremity edema that improved few days after delivery. Over the past few days had worsening LE edema and DOE with orthopnea and PND.   Admitted on 11/2.  On admission she was found to have elevated LFT, and treated for preeclampsia even though her BP was never elevated. Chest CT showed no PE. But did have B/L pleural effusions, cardiomegaly.   She was seen in consult by Dr. Delton See who performed stat echo with EF 20% and moderate RV dysfunction. She was given IV lasix and HF meds initiated.  The HF team was consulted.  Currently her chest feels heavy but is much more comfortable. Able to lie flat. Poor appetite with hard time swallowing   Review of Systems: [y] = yes, [ ]  = no   General: Weight gain [ ] ; Weight loss [ ] ; Anorexia [ ] ; Fatigue [ ] ; Fever [ ] ; Chills [ ] ; Weakness [ ]   Cardiac: Chest pain/pressure Cove.Etienne ]; Resting SOB Cove.Etienne ]; Exertional SOB Cove.Etienne ]; Orthopnea Cove.Etienne ]; Pedal Edema Cove.Etienne ]; Palpitations [ y]; Syncope [ ] ; Presyncope [ ] ; Paroxysmal nocturnal dyspnea[y ]  Pulmonary: Cough [ ] ; Wheezing[ ] ; Hemoptysis[ ] ; Sputum [ ] ; Snoring [ ]   GI: Vomiting[ ] ; Dysphagia[ ] ; Melena[ ] ; Hematochezia [ ] ; Heartburn[ y]; Abdominal pain [ ] ; Constipation [ ] ; Diarrhea [ ] ; BRBPR [ ]   GU: Hematuria[ ] ;  Dysuria [ ] ; Nocturia[ ]   Vascular: Pain in legs with walking [ ] ; Pain in feet with lying flat [ ] ; Non-healing sores [ ] ; Stroke [ ] ; TIA [ ] ; Slurred speech [ ] ;  Neuro: Headaches[ ] ; Vertigo[ ] ; Seizures[ ] ; Paresthesias[ ] ;Blurred vision [ ] ; Diplopia [ ] ; Vision changes [ ]   Ortho/Skin: Arthritis [ ] ; Joint pain [ ] ; Muscle pain [ ] ; Joint swelling [ ] ; Back Pain [ ] ; Rash [ ]   Psych: Depression[ ] ; Anxiety[ ]   Heme: Bleeding problems [ ] ; Clotting disorders [ ] ; Anemia [ ]   Endocrine: Diabetes [ ] ; Thyroid dysfunction[ ]   Home Medications Prior to Admission medications   Medication Sig Start Date End Date Taking? Authorizing Provider  Cholecalciferol (VITAMIN D3) 5000 units CAPS Take 5,000 Units by mouth daily.   Yes [provider]  docusate sodium (COLACE) 100 MG capsule Take 300 mg by mouth daily.   Yes [provider]  ferrous sulfate 325 (65 FE) MG EC tablet Take 1 tablet (325 mg total) by mouth 2 (two) times daily. Patient taking differently: Take 325 mg by mouth daily.  09/26/17 09/26/18 Yes Prothero, Henderson Newcomer, CNM  ibuprofen (ADVIL,MOTRIN) 600 MG tablet Take 1 tablet (600 mg total) by mouth every 6 (six) hours. 09/26/17  Yes Prothero, Henderson Newcomer, CNM  Prenatal Vit-Fe Fumarate-FA (PRENATAL MULTIVITAMIN) TABS tablet Take 1 tablet by mouth daily at  12 noon.   Yes [provider]    Past Medical History: Past Medical History:  Diagnosis Date  . Arthritis   . Hemoglobin E trait (HCC)   . Newborn product of in vitro fertilization (IVF) pregnancy   . Vitamin D deficiency     Past Surgical History: Past Surgical History:  Procedure Laterality Date  . WISDOM TOOTH EXTRACTION      Family History: Family History  Problem Relation Age of Onset  . Hypertension Mother   . Hyperlipidemia Mother   . Osteoporosis Mother   . Hypertension Father   . Breast cancer Maternal Aunt     Social History: Social History   Social History  . Marital  status: Single    Spouse name: N/A  . Number of children: N/A  . Years of education: N/A   Social History Main Topics  . Smoking status: Never Smoker  . Smokeless tobacco: Never Used  . Alcohol use No  . Drug use: No  . Sexual activity: No   Other Topics Concern  . None   Social History Narrative  . None    Allergies:  No Known Allergies  Objective:    Vital Signs:   Temp:  [97.5 F (36.4 C)-98.3 F (36.8 C)] 97.5 F (36.4 C) (11/03 1727) Pulse Rate:  [81-96] 86 (11/03 1947) Resp:  [16-19] 19 (11/03 1947) BP: (102-115)/(74-94) 114/94 (11/03 1947) SpO2:  [96 %-100 %] 100 % (11/03 1947) Weight:  [62.8 kg (138 lb 8 oz)] 62.8 kg (138 lb 8 oz) (11/03 1855)    Weight change: Filed Weights   10/07/17 1855  Weight: 62.8 kg (138 lb 8 oz)    Intake/Output:   Intake/Output Summary (Last 24 hours) at 10/07/17 2020 Last data filed at 10/07/17 1400  Gross per 24 hour  Intake          2128.75 ml  Output             4325 ml  Net         -2196.25 ml      Physical Exam    General:  Fatigued appearing. No resp difficulty HEENT: normal Neck: supple. JVP to jaw . Carotids 2+ bilat; no bruits. No lymphadenopathy or thyromegaly appreciated. Cor: PMI nondisplaced. Regular rate & rhythm. No rubs, gallops or murmurs. No s3 Lungs: crackles at bases Abdomen: soft, mildly tender, mildly distended. Liver edge down about 5-6 CM in MCL. No bruits or masses. Good bowel sounds. Extremities: no cyanosis, clubbing, rash, 3+ edema. Good cap refill  Neuro: alert & orientedx3, cranial nerves grossly intact. moves all 4 extremities w/o difficulty. Affect pleasant   Telemetry   NSR 70-80s. Personally reviewed   EKG    Sinus tach 101. No ST-T wave abnormalities. Personally reviewed   Labs   Basic Metabolic Panel:  Recent Labs Lab 10/06/17 1339 10/07/17 1229  NA 133* 127*  K 4.9 4.3  CL 101 96*  CO2 20* 21*  GLUCOSE 118* 115*  BUN 14 13  CREATININE 0.84 0.82  CALCIUM  8.3* 6.6*  MG  --  7.4*    Liver Function Tests:  Recent Labs Lab 10/06/17 1339 10/07/17 1229  AST 69* 65*  ALT 64* 70*  ALKPHOS 102 100  BILITOT 0.8 0.6  PROT 6.6 6.5  ALBUMIN 3.0* 3.1*   No results for input(s): LIPASE, AMYLASE in the last 168 hours. No results for input(s): AMMONIA in the last 168 hours.  CBC:  Recent Labs Lab 10/06/17  1339 10/07/17 1229  WBC 13.3* 11.8*  HGB 9.7* 9.4*  HCT 29.1* 27.8*  MCV 77.2* 76.2*  PLT 343 318    Cardiac Enzymes:  Recent Labs Lab 10/06/17 1739  CKTOTAL 82  CKMB 4.3  TROPONINI 0.21*    BNP: BNP (last 3 results) No results for input(s): BNP in the last 8760 hours.  ProBNP (last 3 results) No results for input(s): PROBNP in the last 8760 hours.   CBG: No results for input(s): GLUCAP in the last 168 hours.  Coagulation Studies: No results for input(s): LABPROT, INR in the last 72 hours.   Imaging    No results found.   Medications:     Current Medications: . cholecalciferol  5,000 Units Oral Daily  . digoxin  0.125 mg Oral Daily  . docusate sodium  300 mg Oral Daily  . ferrous sulfate  325 mg Oral BID WC  . [START ON 10/08/2017] furosemide  40 mg Intravenous Q12H  . ibuprofen  600 mg Oral Q6H  . 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 Recent studies suggest safety for mom and infant using above agents with close monitoring for drug side effects in her baby. She is leaning  away from breastfeeding. Wants to discuss with her husband. I also spoke with her pediatrician (Dr. Ky BarbanKeifer) who was willing to follow labs on the baby as needed. - OB team at bedside and we discussed need for contraception with IUD - place TED hose - avoid ibuprofen  2. Transaminitis - Due to passive congestion. Should improve with diuresis  3. Hyponatremia - free water restrict   Length of Stay: 1  Astor Gentle, Reuel Boomaniel, MD  10/07/2017, 8:20 PM  Advanced Heart Failure Team Pager [phone] (M-F; 7a - 4p)  Please contact [facility] for night-coverage after hours (4p -7a ) and weekends on [email or web address]

## 2017-10-07 NOTE — Progress Notes (Signed)
Pt reports new onset of severe drowsiness and "is not as alert as before." Attempted to get pt up to bathroom with stand-by assist. Pt was falling asleep at the edge of the bed. Assisted her with a bedside commode. Notified Dr. Dion BodyVarnado of pt status. Ordered to decrease Mag rate to 1g. Will continue to monitor.

## 2017-10-07 NOTE — Progress Notes (Addendum)
1930: Received a call from [facility] requesting Dr Dion BodyVarnado telephone # to discussed transferring patient [place].  1953: Dr Dion BodyVarnado at bedside along with [facility]  1955: Cardiology Dr Romeo AppleBen at bedside.  2017: Digoxin 0.125  Administered.  2029: Lasix 40 mg IV administered.  2100: [facility] notified..  2130: Report called to Nurse Amina.  2158: Aldactone 12.5 mg, Potassium 6017m Eq x 1 given.  2159: Enalapril 2.5 mg administered.  2248: patient [facility] via [facility]. All belonging  take by her husband.

## 2017-10-07 NOTE — Progress Notes (Signed)
Paged Cardiology Dr. Rennis GoldenHilty [phone]

## 2017-10-07 NOTE — Progress Notes (Signed)
Subjective:  Pt is feeling better than on admission but she still has not been able to get a full breath in.  Reports n/v.  Food from last night came up.  She is awaiting medication for heartburn.  Denies headache, visual changes or SOB.  Pt felt better after Lasix.   Pt reports she had a very small bowel movement this am.       Objective: Vital signs in last 24 hours: Temp:  [97.5 F (36.4 C)-98.6 F (37 C)] 97.8 F (36.6 C) (11/03 1245) Pulse Rate:  [81-101] 96 (11/03 1245) Resp:  [14-24] 18 (11/03 1245) BP: (102-123)/(80-88) 114/84 (11/03 1245) SpO2:  [94 %-100 %] 96 % (11/03 1245) UOP: 3000 ml on 7p-7a shift.  Physical Exam:  General: alert, cooperative and no distress, speech is normal.  Heart RRR Lungs CTA bilaterally.  No crackles, rhonci or wheezing at bases. Abd soft, distended, tympanic to percussion. Legs +3 pitting edema to knees. Neuro: 1+ DTR   CBC Latest Ref Rng & Units 10/07/2017 10/06/2017 09/26/2017  WBC 4.0 - 10.5 K/uL 11.8(H) 13.3(H) 14.2(H)  Hemoglobin 12.0 - 15.0 g/dL 1.6(X9.4(L) 0.9(U9.7(L) 7.5(L)  Hematocrit 36.0 - 46.0 % 27.8(L) 29.1(L) 22.2(L)  Platelets 150 - 400 K/uL 318 343 126(L)     CMP  CMP Latest Ref Rng & Units 10/07/2017 10/06/2017 09/25/2017  Glucose 65 - 99 mg/dL 045(W115(H) 098(J118(H) 191(Y137(H)  BUN 6 - 20 mg/dL 13 14 10   Creatinine 0.44 - 1.00 mg/dL 7.820.82 9.560.84 2.13(Y1.19(H)  Sodium 135 - 145 mmol/L 127(L) 133(L) 135  Potassium 3.5 - 5.1 mmol/L 4.3 4.9 3.8  Chloride 101 - 111 mmol/L 96(L) 101 105  CO2 22 - 32 mmol/L 21(L) 20(L) 23  Calcium 8.9 - 10.3 mg/dL 6.6(L) 8.3(L) 8.5(L)  Total Protein 6.5 - 8.1 g/dL 6.5 6.6 8.6(V5.2(L)  Total Bilirubin 0.3 - 1.2 mg/dL 0.6 0.8 0.7  Alkaline Phos 38 - 126 U/L 100 102 171(H)  AST 15 - 41 U/L 65(H) 69(H) 33  ALT 14 - 54 U/L 70(H) 64(H) 15      Imaging Results (Last 48 hours)  Ct Angio Chest Pe W Or Wo Contrast  Result Date: 10/06/2017 CLINICAL DATA: Shortness of breath and epigastric pain for the past 4 days. Patient  status post partum on 09/24/2017. EXAM: CT ANGIOGRAPHY CHEST WITH CONTRAST TECHNIQUE: Multidetector CT imaging of the chest was performed using the standard protocol during bolus administration of intravenous contrast. Multiplanar CT image reconstructions and MIPs were obtained to evaluate the vascular anatomy. CONTRAST: 100 cc Isovue 370. COMPARISON: None. FINDINGS: Cardiovascular: No pulmonary embolus is identified. The pulmonary arteries are well opacified. There is cardiomegaly. No pericardial effusion. Mediastinum/Nodes: No enlarged mediastinal, hilar, or axillary lymph nodes. Thyroid gland, trachea, and esophagus demonstrate no significant findings. Lungs/Pleura: Small to moderate bilateral pleural effusions are present, larger on the right. The lungs demonstrate only mild dependent atelectatic change. Upper Abdomen: Negative. Musculoskeletal: Negative. Review of the MIP images confirms the above findings. IMPRESSION: Negative for pulmonary embolus. Small to moderate bilateral pleural effusions, larger on the right. Cardiomegaly without evidence of pulmonary edema. Electronically Signed By: Drusilla Kannerhomas Dalessio M.D. On: 10/06/2017 14:30             CK 82 CK MB 4.3  Troponin .21   Assessment/Plan: Postpartum Pre eclampsia with severe features, however, pt has only has 1 elevated BP 127/95 since admission. SOB has improved but pt is not at baseline.  Lasix 40 mg IV now.  Strict I/Os. Currently on  Magnesium for seizure prophylaxis.   Due to cardiomegaly and pulmonary edema, consult cardiology to rule out cardiomyopathy. Recheck CBC, CMP, Magnesium level. Dulcolax suppository for constipation. Await cardiology recommendations for further management.  Addendum: Pt was seen at 11 am.  Called within the hour that pt was somnolent.  Decreased magnesium to 1 gr/hr. Labs ordered.  Coby Antrobus, EVELYNCNM 10/07/2017, 1:30 PM

## 2017-10-07 NOTE — Progress Notes (Signed)
  Echocardiogram 2D Echocardiogram has been performed.  Janalyn HarderWest, Trevia Nop R 10/07/2017, 6:37 PM

## 2017-10-07 NOTE — Progress Notes (Signed)
Dr. Rennis GoldenHilty returned call and states that they will consult with pt today

## 2017-10-07 NOTE — Progress Notes (Signed)
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  I spoke with Dr. Delton SeeNelson with cardiology and agree with transfer to [facility].  Dr. Adella HareBensimmon and Lajuan LinesNancy Pethero, CNM also at bedside.  Pt looks much better off Magnesium.  BP (!) 114/94   Pulse 86   Temp (!) 97.5 F (36.4 C) (Oral)   Resp 19   Wt 62.8 kg (138 lb 8 oz)   SpO2 100%   BMI 23.77 kg/m   Plan d/w pt.  Will see pt tomorrow to stay abreast of her condition but I do not feel she has any major obstetric issues at this time.

## 2017-10-07 NOTE — Consult Note (Signed)
Cardiology Consultation:   Patient ID: Baylin Cabal; [id number]; [birth year]   Admit date: 10/06/2017 Date of Consult: 10/07/2017  Primary Care Provider: Patient, No Pcp Per Primary Cardiologist: none/new patient  Patient Profile:   Myleah Kracke is a 46 y.o. female with no prior cardiac history who is being seen today for the evaluation of CHF at the request of Geryl Rankins, MD.  History of Present Illness:   Ms. Mckiver is a very pleasant female with no prior medical history of heart disease, who is 2 weeks postpartum via vaginal delivery. The patient has no problems during pregnancy, she denies any CP, SOB, unusual swelling, palpitations. She delivered a healthy baby girl [name] on 10//22/2018 via vaginal delivery. She was discharged with some lower extremity edema that improved few days after delivery. Starting this Tuesday she developed worsening lower extremity edema, with DOE and eventually SOB at rest with orthopnea and PND. She has been noticing frequent palpitations without dizziness or syncope.  She is breastfeeding. This is her first baby acquired via in vivo fertilization.  On admission she was found to have elevated LFTs, treated for preeclampsia even though her BP was never elevated.  Chest CTA excluded pulmonary embolism, and showed B/L pleural effusions, cardiomegaly.  Cardiology was consulted.  Past Medical History:  Diagnosis Date  . Arthritis   . Hemoglobin E trait (HCC)   . Newborn product of in vitro fertilization (IVF) pregnancy   . Vitamin D deficiency    Past Surgical History:  Procedure Laterality Date  . WISDOM TOOTH EXTRACTION      Inpatient Medications: Scheduled Meds: . cholecalciferol  5,000 Units Oral Daily  . digoxin  0.125 mg Oral Daily  . docusate sodium  300 mg Oral Daily  . ferrous sulfate  325 mg Oral BID WC  . [START ON 10/08/2017] furosemide  40 mg Intravenous Q12H  . ibuprofen  600 mg Oral Q6H  . prenatal multivitamin   1 tablet Oral Q1200   Continuous Infusions:  PRN Meds: bisacodyl  Allergies:   No Known Allergies  Social History:   Social History   Social History  . Marital status: Single    Spouse name: N/A  . Number of children: N/A  . Years of education: N/A   Occupational History  . Not on file.   Social History Main Topics  . Smoking status: Never Smoker  . Smokeless tobacco: Never Used  . Alcohol use No  . Drug use: No  . Sexual activity: No   Other Topics Concern  . Not on file   Social History Narrative  . No narrative on file    Family History:    Family History  Problem Relation Age of Onset  . Hypertension Mother   . Hyperlipidemia Mother   . Osteoporosis Mother   . Hypertension Father   . Breast cancer Maternal Aunt     ROS:  Please see the history of present illness.  ROS  All other ROS reviewed and negative.     Physical Exam/Data:   Vitals:   10/07/17 1400 10/07/17 1727 10/07/17 1855 10/07/17 1900  BP:  108/74    Pulse:  86  85  Resp: 17 18  19   Temp:  (!) 97.5 F (36.4 C)    TempSrc:  Oral    SpO2:  99%  100%  Weight:   138 lb 8 oz (62.8 kg)     Intake/Output Summary (Last 24 hours) at 10/07/17 1933 Last data filed at 10/07/17  1400  Gross per 24 hour  Intake          2203.75 ml  Output             4325 ml  Net         -2121.25 ml   Filed Weights   10/07/17 1855  Weight: 138 lb 8 oz (62.8 kg)   Body mass index is 23.77 kg/m.  General:  Well nourished, well developed, in no acute distress HEENT: normal Lymph: no adenopathy Neck: +6 cm  JVD Endocrine:  No thryomegaly Vascular: No carotid bruits; FA pulses 2+ bilaterally without bruits  Cardiac:  normal S1, S2; +S4, RRR; 3/6 systolic murmur  Lungs:  decreased BS up to 1/3 of the lungs, rales  Abd: soft, nontender, no hepatomegaly  Ext: B/L severely swollen feet, swelling up to the thighs Musculoskeletal:  No deformities, BUE and BLE strength normal and equal Skin: warm and dry    Neuro:  CNs 2-12 intact, no focal abnormalities noted Psych:  Normal affect   EKG:  The EKG was personally reviewed and demonstrates:  Sinus tachycardia, negative  T waves in the inferolateral leads Telemetry:  Telemetry was personally reviewed and demonstrates:    Relevant CV Studies: TTE pending.  Laboratory Data:  Chemistry  Recent Labs Lab 10/06/17 1339 10/07/17 1229  NA 133* 127*  K 4.9 4.3  CL 101 96*  CO2 20* 21*  GLUCOSE 118* 115*  BUN 14 13  CREATININE 0.84 0.82  CALCIUM 8.3* 6.6*  GFRNONAA >60 >60  GFRAA >60 >60  ANIONGAP 12 10     Recent Labs Lab 10/06/17 1339 10/07/17 1229  PROT 6.6 6.5  ALBUMIN 3.0* 3.1*  AST 69* 65*  ALT 64* 70*  ALKPHOS 102 100  BILITOT 0.8 0.6   Hematology  Recent Labs Lab 10/06/17 1339 10/07/17 1229  WBC 13.3* 11.8*  RBC 3.77* 3.65*  HGB 9.7* 9.4*  HCT 29.1* 27.8*  MCV 77.2* 76.2*  MCH 25.7* 25.8*  MCHC 33.3 33.8  RDW 17.3* 17.4*  PLT 343 318   Cardiac Enzymes  Recent Labs Lab 10/06/17 1739  TROPONINI 0.21*   No results for input(s): TROPIPOC in the last 168 hours.  BNPNo results for input(s): BNP, PROBNP in the last 168 hours.  DDimer   Recent Labs Lab 10/06/17 1339  DDIMER 10.56*    Radiology/Studies:  Ct Angio Chest Pe W Or Wo Contrast  Result Date: 10/06/2017 CLINICAL DATA:  Shortness of breath and epigastric pain for the past 4 days. Patient status post partum on 09/24/2017. EXAM: CT ANGIOGRAPHY CHEST WITH CONTRAST TECHNIQUE: Multidetector CT imaging of the chest was performed using the standard protocol during bolus administration of intravenous contrast. Multiplanar CT image reconstructions and MIPs were obtained to evaluate the vascular anatomy. CONTRAST:  100 cc Isovue 370. COMPARISON:  None. FINDINGS: Cardiovascular: No pulmonary embolus is identified. The pulmonary arteries are well opacified. There is cardiomegaly. No pericardial effusion. Mediastinum/Nodes: No enlarged mediastinal, hilar, or  axillary lymph nodes. Thyroid gland, trachea, and esophagus demonstrate no significant findings. Lungs/Pleura: Small to moderate bilateral pleural effusions are present, larger on the right. The lungs demonstrate only mild dependent atelectatic change. Upper Abdomen: Negative. Musculoskeletal: Negative. Review of the MIP images confirms the above findings. IMPRESSION: Negative for pulmonary embolus. Small to moderate bilateral pleural effusions, larger on the right. Cardiomegaly without evidence of pulmonary edema. Electronically Signed   By: Drusilla Kanner M.D.   On: 10/06/2017 14:30   US Abdomen Limited  Ruq  Result Date: 10/06/2017 CLINICAL DATA:  Right upper quadrant pain.  2 weeks postpartum. EXAM: ULTRASOUND ABDOMEN LIMITED RIGHT UPPER QUADRANT COMPARISON:  None. FINDINGS: Gallbladder: No gallstones identified. Diffuse gallbladder wall thickening is seen measuring up to 6 mm. No definite sonographic Murphy sign noted by sonographer. This finding is nonspecific and may be related to hepatocellular disease or other etiologies. Common bile duct: Diameter: 3 mm, within normal limits. Liver: No focal lesion identified. Within normal limits in parenchymal echogenicity. Portal vein is patent on color Doppler imaging with normal direction of blood flow towards the liver. Other: Right pleural effusion Noted. IMPRESSION: No gallstones identified.  No evidence of biliary ductal dilatation. Mild diffuse gallbladder wall thickening, which is nonspecific. This could be related to hepatocellular disease or other etiologies. Right pleural effusion noted. Electronically Signed   By: Myles RosenthalJohn  Stahl M.D.   On: 10/06/2017 15:40    Assessment and Plan:   1. 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 Palpitations - start  telemetry  For questions or updates, please contact [facility] Please consult [email or web address] for contact info under Cardiology/STEMI.   Signed, Tobias AlexanderKatarina Marquitta Persichetti, MD  10/07/2017 7:33 PM

## 2017-10-07 NOTE — Progress Notes (Signed)
Hospital day # 1 Postpartum Preeclampsia  S: Pt states she is breathing more easily this am. Still complaint of reflux and heartburn.  O: BP 112/87 (BP Location: Left Arm)   Pulse 95   Temp 97.7 F (36.5 C) (Oral)   Resp 18   SpO2 98%           Extremities: edema 3+ pitting and no significant edema and no signs of DVT  A: Postpartum Preeclampsia     Pleural Effusion     Cardiomegaly     unchanged  P: continue current plan of care     Protonix added to meds     Magnesium continue to infuse at 2 gms/hr     Plan for cardiology consult later today per Dr. Carey BullocksVarnardo's order Lawson FiscalLori A Shainna Faux  CNM 10/07/2017 10:07 AM

## 2017-10-07 NOTE — Progress Notes (Signed)
CRITICAL VALUE ALERT  Critical Value:  Mag level: 7.4  Date & Time Notied:  10/07/17 1350  Provider Notified: Dr. Dion BodyVarnado  Orders Received/Actions taken: d/c magnesium

## 2017-10-08 ENCOUNTER — Other Ambulatory Visit: Payer: Self-pay

## 2017-10-08 DIAGNOSIS — I5021 Acute systolic (congestive) heart failure: Secondary | ICD-10-CM

## 2017-10-08 LAB — COMPREHENSIVE METABOLIC PANEL
ALT: 61 U/L — ABNORMAL HIGH (ref 14–54)
AST: 51 U/L — ABNORMAL HIGH (ref 15–41)
Albumin: 2.8 g/dL — ABNORMAL LOW (ref 3.5–5.0)
Alkaline Phosphatase: 102 U/L (ref 38–126)
Anion gap: 9 (ref 5–15)
BUN: 12 mg/dL (ref 6–20)
CO2: 25 mmol/L (ref 22–32)
Calcium: 7.5 mg/dL — ABNORMAL LOW (ref 8.9–10.3)
Chloride: 96 mmol/L — ABNORMAL LOW (ref 101–111)
Creatinine, Ser: 0.97 mg/dL (ref 0.44–1.00)
GFR calc Af Amer: >60 mL/min (ref >60)
GFR calc non Af Amer: >60 mL/min (ref >60)
Glucose, Bld: 89 mg/dL (ref 65–99)
Potassium: 4 mmol/L (ref 3.5–5.1)
Sodium: 130 mmol/L — ABNORMAL LOW (ref 135–145)
Total Bilirubin: 0.5 mg/dL (ref 0.3–1.2)
Total Protein: 6 g/dL — ABNORMAL LOW (ref 6.5–8.1)

## 2017-10-08 LAB — CBC
HCT: 30.6 % — ABNORMAL LOW (ref 36.0–46.0)
Hemoglobin: 10 g/dL — ABNORMAL LOW (ref 12.0–15.0)
MCH: 25.1 pg — ABNORMAL LOW (ref 26.0–34.0)
MCHC: 32.7 g/dL (ref 30.0–36.0)
MCV: 76.7 fL — ABNORMAL LOW (ref 78.0–100.0)
Platelets: 357 10*3/uL (ref 150–400)
RBC: 3.99 MIL/uL (ref 3.87–5.11)
RDW: 17.2 % — ABNORMAL HIGH (ref 11.5–15.5)
WBC: 9.7 10*3/uL (ref 4.0–10.5)

## 2017-10-08 LAB — MRSA PCR SCREENING: MRSA by PCR: NEGATIVE

## 2017-10-08 MED ORDER — DOCUSATE SODIUM 100 MG PO CAPS
100.0000 mg | ORAL_CAPSULE | Freq: Every day | ORAL | Status: DC
  Administered 2017-10-09 – 2017-10-10 (×2): 100 mg via ORAL
  Filled 2017-10-08 (×2): qty 1

## 2017-10-08 MED ORDER — ENALAPRIL MALEATE 5 MG PO TABS
5.0000 mg | ORAL_TABLET | Freq: Two times a day (BID) | ORAL | Status: DC
  Administered 2017-10-08 – 2017-10-09 (×2): 5 mg via ORAL
  Filled 2017-10-08 (×2): qty 1

## 2017-10-08 NOTE — Progress Notes (Addendum)
In to see pt.  States she is seeing much better.  Has decided not to breast feed.  Denies pain with engorgement. Lochia normal.  No pain with vaginal repair.  Area examined on admission and appeared normal.  Denies abdominal pain.  Requested to decrease Colace due to soft BMs.  Pt had a lot of flatus which reports has helped abdominal discomfort significantly  .BP 109/82 (BP Location: Left Arm)   Pulse 99   Temp 98.9 F (37.2 C) (Oral)   Resp 18   Ht 5\' 4"  (1.626 m)   Wt 60.9 kg (134 lb 4.2 oz)   SpO2 96%   BMI 23.05 kg/m   Gen:  NAD, A&O.  Affect improved.  Pallor resolved. Breast:  Engorged bilaterally, right greater than left.  Nontender. Abd:  Soft, less distended. Tympanic to percussion. Ext:  Decreased some.  TED hose in place.  A: Postpartum cardiomyopathy.  Responding well to diuresis.   Engorged breasts- currently asymptomatic.  Phone consult with lactation.  See below.  Asked them to call pt or nurse tonight before 11 pm. Vaginal laceration- No issues. IUD recommended postpartum. Recommend insertion at 6 weeks postpartum prior to resuming sexual  intercourse. No other obstetric issues so I will sign off. I will update [facility], primary obstetrician at 7 am on 10/5017.  They will f/u with pt as needed.  Lactation consult with  Mathews ArgyleStephanie Vann.  Recommendations as follows:  Recommended getting cold cabbage leaves from the cafeteria. Place cabbage leaves on breast until they wilt. If she gets engorged with pain, recommends Hand expression or pumping for 5 minutes up two times daily.  Wear a tight fitting bra.  Use NSAIDs prn.  This is the quicker method.  Engorgement should resolve in 2-3 days.  Other option is to completely empty breasts to relieve pressure then put cold cabbage leaves after pumping.  Pt can wean gradually with this method which may be preferred depending on how often she was pumping.   If cabbage leaves not available, ice packs can be  used.  Lactation support: [id number] [phone].  Leave a message.  If emergent, can call [facility], [phone].  Shela NevinEvelyn B. Dion BodyVarnado, MD [facility] Pager [phone] Cell: [phone]

## 2017-10-08 NOTE — Progress Notes (Addendum)
Advanced Heart Failure Rounding Note   Subjective:    Has diuresed well. (-5.7L) Weight down 4 pounds.  Breathing much better. Able to lie flat and walk halls without SOB. Has decided against breastfeeding.    Objective:   Weight Range:  Vital Signs:   Temp:  [97.5 F (36.4 C)-98.8 F (37.1 C)] 98 F (36.7 C) (11/04 1100) Pulse Rate:  [81-90] 88 (11/04 1100) Resp:  [10-26] 17 (11/04 1100) BP: (107-121)/(74-95) 110/79 (11/04 1100) SpO2:  [95 %-100 %] 100 % (11/04 1100) Weight:  [60.9 kg (134 lb 4.2 oz)-62.8 kg (138 lb 8 oz)] 60.9 kg (134 lb 4.2 oz) (11/03 2318) Last BM Date: 10/06/17  Weight change: Filed Weights   10/07/17 1855 10/07/17 2318  Weight: 62.8 kg (138 lb 8 oz) 60.9 kg (134 lb 4.2 oz)    Intake/Output:   Intake/Output Summary (Last 24 hours) at 10/08/2017 1251 Last data filed at 10/08/2017 1200 Gross per 24 hour  Intake 198.75 ml  Output 7300 ml  Net -7101.25 ml     Physical Exam: General:  Sitting up in bed Well appearing. No resp difficulty HEENT: normal Neck: supple. JVP 7-8 . Carotids 2+ bilat; no bruits. No lymphadenopathy or thryomegaly appreciated. Cor: PMI nondisplaced. Tachy regular. No s3 Lungs: clear Abdomen: soft, nontender, nondistended. Liver edge no longer tender. No bruits or masses. Good bowel sounds. Extremities: no cyanosis, clubbing, rash, 2+ edema Neuro: alert & orientedx3, cranial nerves grossly intact. moves all 4 extremities w/o difficulty. Affect pleasant  Telemetry: Sinus tach 100-105 Personally reviewed   Labs: Basic Metabolic Panel: Recent Labs  Lab 10/06/17 1339 10/07/17 1229 10/08/17 0313  NA 133* 127* 130*  K 4.9 4.3 4.0  CL 101 96* 96*  CO2 20* 21* 25  GLUCOSE 118* 115* 89  BUN 14 13 12   CREATININE 0.84 0.82 0.97  CALCIUM 8.3* 6.6* 7.5*  MG  --  7.4*  --     Liver Function Tests: Recent Labs  Lab 10/06/17 1339 10/07/17 1229 10/08/17 0313  AST 69* 65* 51*  ALT 64* 70* 61*  ALKPHOS 102 100 102    BILITOT 0.8 0.6 0.5  PROT 6.6 6.5 6.0*  ALBUMIN 3.0* 3.1* 2.8*   No results for input(s): LIPASE, AMYLASE in the last 168 hours. No results for input(s): AMMONIA in the last 168 hours.  CBC: Recent Labs  Lab 10/06/17 1339 10/07/17 1229 10/08/17 0313  WBC 13.3* 11.8* 9.7  HGB 9.7* 9.4* 10.0*  HCT 29.1* 27.8* 30.6*  MCV 77.2* 76.2* 76.7*  PLT 343 318 357    Cardiac Enzymes: Recent Labs  Lab 10/06/17 1739  CKTOTAL 82  CKMB 4.3  TROPONINI 0.21*    BNP: BNP (last 3 results) Recent Labs    10/07/17 1938  BNP 2,911.8*    ProBNP (last 3 results) No results for input(s): PROBNP in the last 8760 hours.    Other results:  Imaging: Ct Angio Chest Pe W Or Wo Contrast  Result Date: 10/06/2017 CLINICAL DATA:  Shortness of breath and epigastric pain for the past 4 days. Patient status post partum on 09/24/2017. EXAM: CT ANGIOGRAPHY CHEST WITH CONTRAST TECHNIQUE: Multidetector CT imaging of the chest was performed using the standard protocol during bolus administration of intravenous contrast. Multiplanar CT image reconstructions and MIPs were obtained to evaluate the vascular anatomy. CONTRAST:  100 cc Isovue 370. COMPARISON:  None. FINDINGS: Cardiovascular: No pulmonary embolus is identified. The pulmonary arteries are well opacified. There is cardiomegaly. No pericardial  effusion. Mediastinum/Nodes: No enlarged mediastinal, hilar, or axillary lymph nodes. Thyroid gland, trachea, and esophagus demonstrate no significant findings. Lungs/Pleura: Small to moderate bilateral pleural effusions are present, larger on the right. The lungs demonstrate only mild dependent atelectatic change. Upper Abdomen: Negative. Musculoskeletal: Negative. Review of the MIP images confirms the above findings. IMPRESSION: Negative for pulmonary embolus. Small to moderate bilateral pleural effusions, larger on the right. Cardiomegaly without evidence of pulmonary edema. Electronically Signed   By: Drusilla Kannerhomas   Dalessio M.D.   On: 10/06/2017 14:30   [place] Abdomen Limited Ruq  Result Date: 10/06/2017 CLINICAL DATA:  Right upper quadrant pain.  2 weeks postpartum. EXAM: ULTRASOUND ABDOMEN LIMITED RIGHT UPPER QUADRANT COMPARISON:  None. FINDINGS: Gallbladder: No gallstones identified. Diffuse gallbladder wall thickening is seen measuring up to 6 mm. No definite sonographic Murphy sign noted by sonographer. This finding is nonspecific and may be related to hepatocellular disease or other etiologies. Common bile duct: Diameter: 3 mm, within normal limits. Liver: No focal lesion identified. Within normal limits in parenchymal echogenicity. Portal vein is patent on color Doppler imaging with normal direction of blood flow towards the liver. Other: Right pleural effusion Noted. IMPRESSION: No gallstones identified.  No evidence of biliary ductal dilatation. Mild diffuse gallbladder wall thickening, which is nonspecific. This could be related to hepatocellular disease or other etiologies. Right pleural effusion noted. Electronically Signed   By: Myles RosenthalJohn  Stahl M.D.   On: 10/06/2017 15:40      Medications:     Scheduled Medications: . cholecalciferol  5,000 Units Oral Daily  . digoxin  0.125 mg Oral Daily  . docusate sodium  300 mg Oral Daily  . enalapril  2.5 mg Oral BID  . ferrous sulfate  325 mg Oral BID WC  . furosemide  40 mg Intravenous TID AC  . potassium chloride  20 mEq Oral BID  . prenatal multivitamin  1 tablet Oral Q1200  . spironolactone  12.5 mg Oral Daily     Infusions:   PRN Medications:  acetaminophen, bisacodyl   Assessment:    46 y/o healthy woman 2 weeks post-partum from 1st pregnancy [facility] with acute systolic HF due to post-partum CM with EF 20%     Plan/Discussion:     1. 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 Weight down 5 pounds. Pre-pregnancy weight about  115lbs. Max pregnancy weight 129 pounds. She is 134 lbs today.  - Continue to diurese with lasix 40mg  IV q8 - Continue digoxin, spiro and enalapril. Will increase enalapril to 5 bid. (eventually can switch to [facility]) - Has decided against breastfeeding.  - OB team at bedside last night and we discussed need for contraception with IUD - place TED hose - Consider adding low-dose carvedilol tomorrow.   2. Transaminitis - Due to passive congestion. Improving with diuresis  3. Hyponatremia - stable. free water restrict    Length of Stay: 2   Evelyn Kasel MD 10/08/2017, 12:51 PM  Advanced Heart Failure Team Pager [phone] (M-F; 7a - 4p)  Please contact [facility] for night-coverage after hours (4p -7a ) and weekends on [email or web address]

## 2017-10-08 NOTE — Lactation Note (Signed)
Lactation Consultation Note  Patient Name: Evelyn Clark Today's Date: 10/08/2017   Called pt at request of Dr. Dion BodyVarnado for a telephone consult with patient.   LC clarified feeding/pumping frequency over past several days.   Since birth mom has done some breastfeeding and some formula feeding (mixed feedings).  Mom reports she stopped breastfeeding with no pumpings for a few days and then went back to mixed feedings.  On Thursday (3 days ago) mom reports breastfeeding infant only 3 times and that was the last breastfeeding.  On Friday and Saturday mom reports pumping only 1 times.  Today, Sunday, mom has not pumped at all and reports that breasts are firm and leaking but not "solid".  Mom denies any pain with breast at this time.   LC suggested that since it sounds like mom never developed a full milk supply to start with and then over the past several days has gradually weaned the feeding/pumping frequency, then to hand express small amounts off if breasts become too uncomfortable but if not painful then to leave breasts alone (no pumping or hand expressing).  Encouraged to use cold cabbage leaves and wear firm fitting bra (like a sports bra).  Encouraged mom to have RN call cafeteria for availability of cabbage leaves.  If not available then use ice packs.   Mom able to teach-back information and was appreciative of information given.    Lendon KaVann, Samvel Zinn Walker 10/08/2017, 6:57 PM

## 2017-10-09 LAB — CBC
HCT: 32.8 % — ABNORMAL LOW (ref 36.0–46.0)
Hemoglobin: 10.5 g/dL — ABNORMAL LOW (ref 12.0–15.0)
MCH: 25.5 pg — ABNORMAL LOW (ref 26.0–34.0)
MCHC: 32 g/dL (ref 30.0–36.0)
MCV: 79.6 fL (ref 78.0–100.0)
Platelets: 398 10*3/uL (ref 150–400)
RBC: 4.12 MIL/uL (ref 3.87–5.11)
RDW: 18 % — ABNORMAL HIGH (ref 11.5–15.5)
WBC: 8.7 10*3/uL (ref 4.0–10.5)

## 2017-10-09 LAB — COMPREHENSIVE METABOLIC PANEL
ALBUMIN: 2.8 g/dL — AB (ref 3.5–5.0)
ALT: 47 U/L (ref 14–54)
AST: 38 U/L (ref 15–41)
Alkaline Phosphatase: 104 U/L (ref 38–126)
Anion gap: 9 (ref 5–15)
BUN: 13 mg/dL (ref 6–20)
CHLORIDE: 100 mmol/L — AB (ref 101–111)
CO2: 28 mmol/L (ref 22–32)
CREATININE: 1.12 mg/dL — AB (ref 0.44–1.00)
Calcium: 8.5 mg/dL — ABNORMAL LOW (ref 8.9–10.3)
GFR calc Af Amer: >60 mL/min (ref >60)
GFR calc non Af Amer: 58 mL/min — ABNORMAL LOW (ref >60)
GLUCOSE: 91 mg/dL (ref 65–99)
POTASSIUM: 4 mmol/L (ref 3.5–5.1)
SODIUM: 137 mmol/L (ref 135–145)
Total Bilirubin: 0.6 mg/dL (ref 0.3–1.2)
Total Protein: 6 g/dL — ABNORMAL LOW (ref 6.5–8.1)

## 2017-10-09 LAB — MAGNESIUM: MAGNESIUM: 2.5 mg/dL — AB (ref 1.7–2.4)

## 2017-10-09 MED ORDER — CARVEDILOL 3.125 MG PO TABS: 3.1250 mg | ORAL_TABLET | Freq: Two times a day (BID) | ORAL | Status: DC

## 2017-10-09 MED ORDER — FUROSEMIDE 40 MG PO TABS: 40.0000 mg | ORAL_TABLET | Freq: Every day | ORAL | Status: DC

## 2017-10-09 MED ORDER — LOSARTAN POTASSIUM 25 MG PO TABS
25.0000 mg | ORAL_TABLET | Freq: Two times a day (BID) | ORAL | Status: DC
  Administered 2017-10-09 – 2017-10-10 (×2): 25 mg via ORAL
  Filled 2017-10-09 (×2): qty 1

## 2017-10-09 NOTE — Progress Notes (Signed)
Advanced Heart Failure Rounding Note   Subjective:    Feeling great this am. Denies lightheadedness or dizziness. No CP or SOB. HR ranging 90-100s.  Negative 6.2 L, though no intake recorded. Weight down 16 lbs over last 2 days. ( No weight yesterday).   Objective:   Weight Range:  Vital Signs:   Temp:  [98 F (36.7 C)-99 F (37.2 C)] 99 F (37.2 C) (11/05 0400) Pulse Rate:  [83-101] 101 (11/05 0903) Resp:  [14-20] 16 (11/05 0400) BP: (103-116)/(72-85) 116/85 (11/05 0907) SpO2:  [94 %-100 %] 94 % (11/05 0400) Weight:  [118 lb 3.2 oz (53.6 kg)] 118 lb 3.2 oz (53.6 kg) (11/05 0400) Last BM Date: 10/06/17  Weight change: Filed Weights   10/07/17 1855 10/07/17 2318 10/09/17 0400  Weight: 138 lb 8 oz (62.8 kg) 134 lb 4.2 oz (60.9 kg) 118 lb 3.2 oz (53.6 kg)   Intake/Output:   Intake/Output Summary (Last 24 hours) at 10/09/2017 0914 Last data filed at 10/09/2017 0600 Gross per 24 hour  Intake -  Output 6250 ml  Net -6250 ml    Physical Exam: General: Sitting up in bed. Well appearing. No resp difficulty. HEENT: Normal Neck: Supple. JVP 5-6 cm. Carotids 2+ bilat; no bruits. No thyromegaly or nodule noted. Cor: PMI nondisplaced. Tachy, regular. No S3.  Lungs: Clear, normal effort.  Abdomen: Soft, non-tender, non-distended, no HSM. No bruits or masses. +BS  Extremities: No cyanosis, clubbing, or rash.  1+ ankle edema.   Neuro: Alert & orientedx3, cranial nerves grossly intact. moves all 4 extremities w/o difficulty. Affect pleasant   Telemetry: NSR/ST, 90-100s. Personally reviewed.   Labs: Basic Metabolic Panel: Recent Labs  Lab 10/06/17 1339 10/07/17 1229 10/08/17 0313 10/09/17 0310  NA 133* 127* 130* 137  K 4.9 4.3 4.0 4.0  CL 101 96* 96* 100*  CO2 20* 21* 25 28  GLUCOSE 118* 115* 89 91  BUN 14 13 12 13   CREATININE 0.84 0.82 0.97 1.12*  CALCIUM 8.3* 6.6* 7.5* 8.5*  MG  --  7.4*  --  2.5*    Liver Function Tests: Recent Labs  Lab 10/06/17 1339  10/07/17 1229 10/08/17 0313 10/09/17 0310  AST 69* 65* 51* 38  ALT 64* 70* 61* 47  ALKPHOS 102 100 102 104  BILITOT 0.8 0.6 0.5 0.6  PROT 6.6 6.5 6.0* 6.0*  ALBUMIN 3.0* 3.1* 2.8* 2.8*   No results for input(s): LIPASE, AMYLASE in the last 168 hours. No results for input(s): AMMONIA in the last 168 hours.  CBC: Recent Labs  Lab 10/06/17 1339 10/07/17 1229 10/08/17 0313 10/09/17 0310  WBC 13.3* 11.8* 9.7 8.7  HGB 9.7* 9.4* 10.0* 10.5*  HCT 29.1* 27.8* 30.6* 32.8*  MCV 77.2* 76.2* 76.7* 79.6  PLT 343 318 357 398    Cardiac Enzymes: Recent Labs  Lab 10/06/17 1739  CKTOTAL 82  CKMB 4.3  TROPONINI 0.21*    BNP: BNP (last 3 results) Recent Labs    10/07/17 1938  BNP 2,911.8*    ProBNP (last 3 results) No results for input(s): PROBNP in the last 8760 hours.    Other results:  Imaging: No results found.   Medications:     Scheduled Medications: . cholecalciferol  5,000 Units Oral Daily  . digoxin  0.125 mg Oral Daily  . docusate sodium  100 mg Oral Daily  . enalapril  5 mg Oral BID  . ferrous sulfate  325 mg Oral BID WC  . furosemide  40 mg  Intravenous TID AC  . potassium chloride  20 mEq Oral BID  . prenatal multivitamin  1 tablet Oral Q1200  . spironolactone  12.5 mg Oral Daily    Infusions:   PRN Medications: acetaminophen, bisacodyl   Assessment:   46 y/o healthy woman 2 weeks post-partum from 1st pregnancy [facility] with acute systolic HF due to post-partum CM with EF 20%  Plan/Discussion:     1. 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 She is 118 lbs this am. Down 16 lbs total.  - Volume status much improved.  - Stop IV lasix - Start lasix 40 mg daily, though likely tomorrow.  - Continue digoxin 0.125 mg daily.  - Continue spiro 12.5 mg daily.  - Continue enalapril 5 mg BID. Will switch to losartan 25 mg BID to  prep [facility]  - Hold off on coreg for now.  - Pt has decided against breastfeeding.  - OB team at bedside 10/07/17 and discussed need for contraception with IUD - Continue ted hose.   2. Transaminitis - Due to passive congestion. - Improving with diuresis.   3. Hyponatremia - Stable. Na 137 this am.  - Continue free water restrict.   4. AKI - Creatinine 0.82 -> 0.97 -> 1.12 - Likely need to switch to po diuretics.   Length of Stay: 3  Evelyn SchoolMichael Andrew Tillery, PA-C  10/09/2017, 9:14 AM  Advanced Heart Failure Team Pager [phone] (M-F; 7a - 4p)  Please contact [facility] for night-coverage after hours (4p -7a ) and weekends on [email or web address]  Patient seen and examined with the above-signed Advanced Practice Provider and/or Housestaff. I personally reviewed laboratory data, imaging studies and relevant notes. I independently examined the patient and formulated the important aspects of the plan. I have edited the note to reflect any of my changes or salient points. I have personally discussed the plan with the patient and/or family.  She is much improved but still tenuous and tachycardic. Weight nearing baseline. Still with some mild ankle edema but creatinine starting to climb so I think we need to stop IV diuresis. Will adjust HF meds as above. No b-blocker yet. Hopefully we can get her home in next 24-48 hours. Long discussion about monitoring fluid levels and activity levels.   Evelyn MeresBensimhon, Evelyn Sibrian, MD  7:11 PM

## 2017-10-09 NOTE — Progress Notes (Signed)
Entered in error

## 2017-10-10 LAB — COMPREHENSIVE METABOLIC PANEL
ALK PHOS: 107 U/L (ref 38–126)
ALT: 37 U/L (ref 14–54)
AST: 31 U/L (ref 15–41)
Albumin: 3 g/dL — ABNORMAL LOW (ref 3.5–5.0)
Anion gap: 8 (ref 5–15)
BILIRUBIN TOTAL: 0.7 mg/dL (ref 0.3–1.2)
BUN: 11 mg/dL (ref 6–20)
CALCIUM: 9 mg/dL (ref 8.9–10.3)
CO2: 24 mmol/L (ref 22–32)
CREATININE: 0.93 mg/dL (ref 0.44–1.00)
Chloride: 105 mmol/L (ref 101–111)
GFR calc Af Amer: >60 mL/min (ref >60)
Glucose, Bld: 104 mg/dL — ABNORMAL HIGH (ref 65–99)
POTASSIUM: 4.2 mmol/L (ref 3.5–5.1)
Sodium: 137 mmol/L (ref 135–145)
TOTAL PROTEIN: 6.4 g/dL — AB (ref 6.5–8.1)

## 2017-10-10 LAB — CBC
HCT: 33.8 % — ABNORMAL LOW (ref 36.0–46.0)
Hemoglobin: 10.7 g/dL — ABNORMAL LOW (ref 12.0–15.0)
MCH: 25.4 pg — ABNORMAL LOW (ref 26.0–34.0)
MCHC: 31.7 g/dL (ref 30.0–36.0)
MCV: 80.1 fL (ref 78.0–100.0)
Platelets: 432 10*3/uL — ABNORMAL HIGH (ref 150–400)
RBC: 4.22 MIL/uL (ref 3.87–5.11)
RDW: 17.6 % — ABNORMAL HIGH (ref 11.5–15.5)
WBC: 8.6 10*3/uL (ref 4.0–10.5)

## 2017-10-10 MED ORDER — ACETAMINOPHEN 500 MG PO TABS: 500.0000 mg | ORAL_TABLET | Freq: Three times a day (TID) | ORAL | Status: DC | PRN

## 2017-10-10 MED ORDER — DIGOXIN 125 MCG PO TABS: 0.1250 mg | ORAL_TABLET | Freq: Every day | ORAL | 6 refills | Status: DC

## 2017-10-10 MED ORDER — SPIRONOLACTONE 25 MG PO TABS: 12.5000 mg | ORAL_TABLET | Freq: Every day | ORAL | 6 refills | Status: DC

## 2017-10-10 MED ORDER — FUROSEMIDE 20 MG PO TABS: 20.0000 mg | ORAL_TABLET | Freq: Every day | ORAL | Status: DC

## 2017-10-10 MED ORDER — LOSARTAN POTASSIUM 25 MG PO TABS: 25.0000 mg | ORAL_TABLET | Freq: Two times a day (BID) | ORAL | 6 refills | Status: DC

## 2017-10-10 MED ORDER — POTASSIUM CHLORIDE CRYS ER 20 MEQ PO TBCR: 20.0000 meq | EXTENDED_RELEASE_TABLET | Freq: Every day | ORAL | 6 refills | Status: DC

## 2017-10-10 MED ORDER — FUROSEMIDE 20 MG PO TABS: 20.0000 mg | ORAL_TABLET | Freq: Every day | ORAL | 6 refills | Status: DC

## 2017-10-10 NOTE — Discharge Summary (Signed)
Advanced Heart Failure Discharge Note  Discharge Summary   Patient ID: Domenic Morashimmachanh Satz MRN: [id number], DOB/AGE: 1971-10-05 46 y.o. Admit date: 10/06/2017 D/C date:     10/10/2017   Primary Discharge Diagnoses:  1. 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 Transaminitis 3. Hyponatremia 4. AKI  Hospital Course:  Gianah Kiesling is a 46 y.o. female with no prior medical history of heart disease, who was 2 weeks post-partum via vaginal delivery at the time of admission. She had uncomplicated pregnancy with no unusual cardiac symptoms.   Admitted 10/06/17 with orthopnea, LE edema, and DOE. Found to have LFT elevation so treated for pre-eclampsia though had no BP elevation. CT chest showed no PE, though did show B/L pleural effusions and cardiomegaly.   Dr. Delton SeeNelson consulted who performed stat echo which showed EF 20% and moderate RV dysfunction. GDMT initiated and HF team consulted.   We had long discussion about risks/benefits of breastfeeding, and with HF meds in play, pt decided against breastfeeding. OB team and Dr. Gala RomneyBensimhon discussed  Need for contraception with IUD.   Pt LFTs trended down with diuresis and NA improved as well.   Pt HF meds titrated as tolerated, but were limited due to soft pressures (systolic 90-110s) and mild AKI (0.8 -> 1.1).   These both improved prior to discharge.   Pt examined am of 10/10/17 and thought stable for discharge home with close follow up in the HF clinic as below.   BB and Entresto to be considered as outpatient.   Discharge Weight Range: 112 lbs Discharge Vitals: Blood pressure (!) 120/103, pulse 95, temperature 97.8 F (36.6 C), temperature source Axillary, resp. rate 19, height 5\' 4"  (1.626 m), weight 112 lb 14 oz (51.2 kg), SpO2 98 %, unknown if currently breastfeeding.  Labs: Lab Results  Component Value Date   WBC 8.6 10/10/2017   HGB 10.7 (L) 10/10/2017   HCT 33.8 (L) 10/10/2017   MCV 80.1 10/10/2017   PLT 432 (H)  10/10/2017    Recent Labs  Lab 10/10/17 0231  NA 137  K 4.2  CL 105  CO2 24  BUN 11  CREATININE 0.93  CALCIUM 9.0  PROT 6.4*  BILITOT 0.7  ALKPHOS 107  ALT 37  AST 31  GLUCOSE 104*   No results found for: CHOL, HDL, LDLCALC, TRIG BNP (last 3 results) Recent Labs    10/07/17 1938  BNP 2,911.8*    ProBNP (last 3 results) No results for input(s): PROBNP in the last 8760 hours.   Diagnostic Studies/Procedures   No results found.  Discharge Medications   Allergies as of 10/10/2017   No Known Allergies     Medication List    STOP taking these medications   ibuprofen 600 MG tablet Commonly known as:  ADVIL,MOTRIN     TAKE these medications   acetaminophen 500 MG tablet Commonly known as:  TYLENOL Take 1-2 tablets (500-1,000 mg total) every 8 (eight) hours as needed by mouth for mild pain or moderate pain.   digoxin 0.125 MG tablet Commonly known as:  LANOXIN Take 1 tablet (0.125 mg total) daily by mouth. Start taking on:  10/11/2017   docusate sodium 100 MG capsule Commonly known as:  COLACE Take 300 mg by mouth daily.   ferrous sulfate 325 (65 FE) MG EC tablet Take 1 tablet (325 mg total) by mouth 2 (two) times daily. What changed:  when to take this   furosemide 20 MG tablet Commonly known as:  LASIX Take  1 tablet (20 mg total) daily by mouth. Start taking on:  10/11/2017   losartan 25 MG tablet Commonly known as:  COZAAR Take 1 tablet (25 mg total) 2 (two) times daily by mouth.   potassium chloride SA 20 MEQ tablet Commonly known as:  K-DUR,KLOR-CON Take 1 tablet (20 mEq total) daily by mouth.   prenatal multivitamin Tabs tablet Take 1 tablet by mouth daily at 12 noon.   spironolactone 25 MG tablet Commonly known as:  ALDACTONE Take 0.5 tablets (12.5 mg total) daily by mouth. Start taking on:  10/11/2017   Vitamin D3 5000 units Caps Take 5,000 Units by mouth daily.       Disposition   The patient will be discharged in stable  condition to home.  Discharge Instructions    (HEART FAILURE PATIENTS) Call MD:  Anytime you have any of the following symptoms: 1) 3 pound weight gain in 24 hours or 5 pounds in 1 week 2) shortness of breath, with or without a dry hacking cough 3) swelling in the hands, feet or stomach 4) if you have to sleep on extra pillows at night in order to breathe.   Complete by:  As directed    Diet - low sodium heart healthy   Complete by:  As directed    Heart Failure patients record your daily weight using the same scale at the same time of day   Complete by:  As directed    Increase activity slowly   Complete by:  As directed    STOP any activity that causes chest pain, shortness of breath, dizziness, sweating, or exessive weakness   Complete by:  As directed      Follow-up Information    [facility] Follow up on 10/17/2017.   Specialty:  Cardiology Why:  At 200 pm for post hospital follow up. Please bring all of your medications to your visit. The code for parking is 8001. Psychologist, sport and exercisenter thru [employer] [address] of [address]. Underground parking on your right. Can also park in lower ED lot and enter blue awning.  Contact information: [address] [id number] [place] [id number] [phone]            Duration of Discharge Encounter: Greater than 35 minutes   Signed, Luane SchoolMichael Andrew Tillery, PA-C 10/10/2017, 3:19 PM   Patient seen and examined with the above-signed Advanced Practice Provider and/or Housestaff. I personally reviewed laboratory data, imaging studies and relevant notes. I independently examined the patient and formulated the important aspects of the plan. I have edited the note to reflect any of my changes or salient points. I have personally discussed the plan with the patient and/or family.  Much improved. Will follow closely in HF Clinic. Ok for d/c today. Continue to adjust HF meds in Clinic.   Arvilla MeresBensimhon,  Dirk Vanaman, MD  10:08 PM

## 2017-10-10 NOTE — Progress Notes (Signed)
Pt given d/c instructions & prescriptions, educated, reminded to unplug any chargers & collect all belongings, IV removed. Kathaleen Maseralled Lisa from pharmacy to finish discussing new meds with pt per pt request.  Waiting on husband to return as her ride home.   [place]  Kysean Sweet, RN

## 2017-10-10 NOTE — Progress Notes (Signed)
Advanced Heart Failure Rounding Note   Subjective:    Feeling good this am. Anxious to go home. Worried about work. No lightheadedness or dizziness. No CP or SOB.   Negative 4.3L. No weight today.   Objective:   Weight Range:  Vital Signs:   Temp:  [97.5 F (36.4 C)-99.4 F (37.4 C)] 99 F (37.2 C) (11/06 0829) Pulse Rate:  [95-104] 104 (11/06 0829) Resp:  [16-20] 20 (11/06 0829) BP: (113-124)/(78-86) 119/83 (11/06 0829) SpO2:  [98 %-99 %] 98 % (11/06 0323) Last BM Date: 10/08/17  Weight change: Filed Weights   10/07/17 1855 10/07/17 2318 10/09/17 0400  Weight: 138 lb 8 oz (62.8 kg) 134 lb 4.2 oz (60.9 kg) 118 lb 3.2 oz (53.6 kg)   Intake/Output:   Intake/Output Summary (Last 24 hours) at 10/10/2017 0903 Last data filed at 10/10/2017 0323 Gross per 24 hour  Intake 1256 ml  Output 5600 ml  Net -4344 ml    Physical Exam: General: Well appearing. No resp difficulty. Sitting in bed with baby.  HEENT: Normal Neck: Supple. JVP 6-7  Carotids 2+ bilat; no bruits. No thyromegaly or nodule noted. Cor: PMI nondisplaced. Regular, tachy. No S3.  Lungs: CTAB, normal effort. Abdomen: Soft, non-tender, non-distended, no HSM. No bruits or masses. +BS  Extremities: No cyanosis, clubbing, or rash. Trace ankle edema Neuro: Alert & orientedx3, cranial nerves grossly intact. moves all 4 extremities w/o difficulty. Affect pleasant   Telemetry: NSR/ST 90-100s, Personally reviewed.   Labs: Basic Metabolic Panel: Recent Labs  Lab 10/06/17 1339 10/07/17 1229 10/08/17 0313 10/09/17 0310 10/10/17 0231  NA 133* 127* 130* 137 137  K 4.9 4.3 4.0 4.0 4.2  CL 101 96* 96* 100* 105  CO2 20* 21* 25 28 24   GLUCOSE 118* 115* 89 91 104*  BUN 14 13 12 13 11   CREATININE 0.84 0.82 0.97 1.12* 0.93  CALCIUM 8.3* 6.6* 7.5* 8.5* 9.0  MG  --  7.4*  --  2.5*  --     Liver Function Tests: Recent Labs  Lab 10/06/17 1339 10/07/17 1229 10/08/17 0313 10/09/17 0310 10/10/17 0231  AST 69*  65* 51* 38 31  ALT 64* 70* 61* 47 37  ALKPHOS 102 100 102 104 107  BILITOT 0.8 0.6 0.5 0.6 0.7  PROT 6.6 6.5 6.0* 6.0* 6.4*  ALBUMIN 3.0* 3.1* 2.8* 2.8* 3.0*   No results for input(s): LIPASE, AMYLASE in the last 168 hours. No results for input(s): AMMONIA in the last 168 hours.  CBC: Recent Labs  Lab 10/06/17 1339 10/07/17 1229 10/08/17 0313 10/09/17 0310 10/10/17 0231  WBC 13.3* 11.8* 9.7 8.7 8.6  HGB 9.7* 9.4* 10.0* 10.5* 10.7*  HCT 29.1* 27.8* 30.6* 32.8* 33.8*  MCV 77.2* 76.2* 76.7* 79.6 80.1  PLT 343 318 357 398 432*    Cardiac Enzymes: Recent Labs  Lab 10/06/17 1739  CKTOTAL 82  CKMB 4.3  TROPONINI 0.21*    BNP: BNP (last 3 results) Recent Labs    10/07/17 1938  BNP 2,911.8*    ProBNP (last 3 results) No results for input(s): PROBNP in the last 8760 hours.    Other results:  Imaging: No results found.   Medications:     Scheduled Medications: . cholecalciferol  5,000 Units Oral Daily  . digoxin  0.125 mg Oral Daily  . docusate sodium  100 mg Oral Daily  . ferrous sulfate  325 mg Oral BID WC  . furosemide  40 mg Oral Daily  . losartan  25 mg Oral BID  . potassium chloride  20 mEq Oral BID  . prenatal multivitamin  1 tablet Oral Q1200  . spironolactone  12.5 mg Oral Daily    Infusions:   PRN Medications: acetaminophen, bisacodyl  Assessment:   46 y/o healthy woman 2 weeks post-partum from 1st pregnancy [facility] with acute systolic HF due to post-partum CM with EF 20%  Plan/Discussion:    1. 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 Down > 16 lbs total. Weight pending.  - Volume status much improved.  - Start lasix 40 mg daily today. May be too much. Will follow closely next week.  - Continue digoxin 0.125 mg daily.  - Continue spiro 12.5 mg daily.  - Continue losartan 25 mg BID.  - Hold off on coreg for now.  -  Pt has decided against breastfeeding.  - OB team at bedside 10/07/17 and discussed need for contraception with IUD - Continue ted hose.   2. Transaminitis - Due to passive congestion. Now WNL.   3. Hyponatremia - Stable. Na 137 this am.  - Continue to watch free water intake.   4. AKI - Creatinine 0.82 -> 0.97 -> 1.12 -> 0.93 - Switching to po diuretics.   Home today with close follow up next week in HF clinic.   Length of Stay: [address]  Graciella FreerMichael Andrew Tillery, [place]-C  10/10/2017, 9:03 AM  Advanced Heart Failure Team Pager [phone] (M-F; 7a - 4p)  Please contact [facility] for night-coverage after hours (4p -7a ) and weekends on [email or web address]  Patient seen and examined with the above-signed Advanced Practice Provider and/or Housestaff. I personally reviewed laboratory data, imaging studies and relevant notes. I independently examined the patient and formulated the important aspects of the plan. I have edited the note to reflect any of my changes or salient points. I have personally discussed the plan with the patient and/or family.  Much improved today. She is ready for d/c. Long discussion about meds and activity levels. Will have f/u next week. Wil start b-blocker in Clinic. Also consider Entresto. Understands need for contraception down the road.   Arvilla MeresBensimhon, Daniel, MD  9:33 AM

## 2017-10-16 ENCOUNTER — Telehealth (HOSPITAL_COMMUNITY): Payer: Self-pay

## 2017-10-16 NOTE — Telephone Encounter (Signed)
CHF Clinic appointment reminder call placed to patient for upcoming post-hospital follow up.  LVMTCB to confirm apt.  Patient also reminded to take all medications as prescribed on the day of his/her appointment and to bring all medications to this appointment.  Advised to call our office for tardiness or cancellations/rescheduling needs.  .Bradley, Megan Genevea  

## 2017-10-17 ENCOUNTER — Ambulatory Visit (HOSPITAL_COMMUNITY)
Admit: 2017-10-17 | Discharge: 2017-10-17 | Disposition: A | Discharge status: Home / Self Care | Payer: Managed Care, Other (non HMO) | Source: Ambulatory Visit | Attending: Cardiology | Admitting: Cardiology

## 2017-10-17 VITALS — BP 120/62 | HR 93 | Wt 110.4 lb

## 2017-10-17 DIAGNOSIS — Z8249 Family history of ischemic heart disease and other diseases of the circulatory system: Secondary | ICD-10-CM | POA: Insufficient documentation

## 2017-10-17 DIAGNOSIS — D582 Other hemoglobinopathies: Secondary | ICD-10-CM | POA: Insufficient documentation

## 2017-10-17 DIAGNOSIS — Z8262 Family history of osteoporosis: Secondary | ICD-10-CM | POA: Insufficient documentation

## 2017-10-17 DIAGNOSIS — M1990 Unspecified osteoarthritis, unspecified site: Secondary | ICD-10-CM | POA: Insufficient documentation

## 2017-10-17 DIAGNOSIS — I5022 Chronic systolic (congestive) heart failure: Secondary | ICD-10-CM | POA: Insufficient documentation

## 2017-10-17 DIAGNOSIS — O903 Peripartum cardiomyopathy: Secondary | ICD-10-CM | POA: Diagnosis not present

## 2017-10-17 DIAGNOSIS — N179 Acute kidney failure, unspecified: Secondary | ICD-10-CM | POA: Insufficient documentation

## 2017-10-17 DIAGNOSIS — Z79899 Other long term (current) drug therapy: Secondary | ICD-10-CM | POA: Diagnosis not present

## 2017-10-17 DIAGNOSIS — E559 Vitamin D deficiency, unspecified: Secondary | ICD-10-CM | POA: Insufficient documentation

## 2017-10-17 DIAGNOSIS — E871 Hypo-osmolality and hyponatremia: Secondary | ICD-10-CM | POA: Diagnosis not present

## 2017-10-17 LAB — BASIC METABOLIC PANEL WITH GFR
Anion gap: 7 (ref 5–15)
BUN: 12 mg/dL (ref 6–20)
CO2: 26 mmol/L (ref 22–32)
Calcium: 8.9 mg/dL (ref 8.9–10.3)
Chloride: 105 mmol/L (ref 101–111)
Creatinine, Ser: 1.17 mg/dL — ABNORMAL HIGH (ref 0.44–1.00)
GFR calc Af Amer: >60 mL/min
GFR calc non Af Amer: 55 mL/min — ABNORMAL LOW
Glucose, Bld: 95 mg/dL (ref 65–99)
Potassium: 4 mmol/L (ref 3.5–5.1)
Sodium: 138 mmol/L (ref 135–145)

## 2017-10-17 LAB — DIGOXIN LEVEL: Digoxin Level: 0.4 ng/mL — ABNORMAL LOW (ref 0.8–2.0)

## 2017-10-17 LAB — TSH: TSH: 0.307 u[IU]/mL — ABNORMAL LOW (ref 0.350–4.500)

## 2017-10-17 MED ORDER — SACUBITRIL-VALSARTAN 24-26 MG PO TABS: 1.0000 | ORAL_TABLET | Freq: Two times a day (BID) | ORAL | 3 refills | Status: DC

## 2017-10-17 MED ORDER — FUROSEMIDE 20 MG PO TABS: 20.0000 mg | ORAL_TABLET | ORAL | 6 refills | Status: DC | PRN

## 2017-10-17 NOTE — Patient Instructions (Signed)
Labs today (will call for abnormal results, otherwise no news is good news)  STOP taking Losartan  STOP taking lasix daily, only take it as needed for wt gain greater than 3 lbs overnight or 5 lbs in a week.   START taking Entresto 24-26 mg (1 Tablet) Twice Daily  Labs in 10 days (bmet)  Follow up with Cicero DuckErika our pharmD in 3 weeks  Follow up with Dr. Leory PlowmanBenismhon in 6 weeks.

## 2017-10-17 NOTE — Progress Notes (Signed)
Advanced Heart Failure Clinic Note   Primary Cardiologist: Dr. Bensimhon   HPI:  Evelyn Clark is a 46 y.o. 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   Admitted 11/2 -10/10/17 with new systolic CHF. She was 2 weeks post-partum via vaginal delivery at the time of admission. She had uncomplicated pregnancy with no unusual cardiac symptoms. Admitted with orthopnea, LE edema, and DOE. Found to have LFT elevation so treated for pre-eclampsia though had no BP elevation. CT chest showed no PE, though did show B/L pleural effusions and cardiomegaly. Stat echo showed EF 20% and moderate RV dysfunction. GDMT initiated and HF team consulted.   We had long discussion about risks/benefits of breastfeeding, and with HF meds in play, pt decided against breastfeeding. OB team and Dr. Gala RomneyBensimhon discussed need for contraception with IUD.   She presents today for regular follow up. Feeling good overall. Has continued to have some fatigue, especially in the mornings. She denies any lightheadedness or dizziness. No orthopnea, PND, or peripheral edema. She is enjoying her time with the baby. She felt overwhelmed for the first few days at home, but is feeling better now.  Has her husband and her mom at home for the time being. Taking all medication as directed.  Has a prodigious appetite. Eager to get back to full activity.   Review of systems complete and found to be negative unless listed in HPI.    Past Medical History:  Diagnosis Date  . Arthritis   . Hemoglobin E trait (HCC)   . Newborn product of in vitro fertilization (IVF) pregnancy   . Vitamin D deficiency     Current Outpatient Medications  Medication Sig Dispense Refill  . acetaminophen (TYLENOL) 500 MG tablet Take 1-2 tablets (500-1,000 mg total) every 8 (eight) hours as needed by mouth for mild pain or moderate pain.    . Cholecalciferol (VITAMIN D3) 5000 units CAPS Take 5,000 Units by mouth daily.    .  digoxin (LANOXIN) 0.125 MG tablet Take 1 tablet (0.125 mg total) daily by mouth. 30 tablet 6  . docusate sodium (COLACE) 100 MG capsule Take 300 mg by mouth daily.    . ferrous sulfate 325 (65 FE) MG EC tablet Take 1 tablet (325 mg total) by mouth 2 (two) times daily. (Patient taking differently: Take 325 mg by mouth daily. ) 60 tablet 3  . furosemide (LASIX) 20 MG tablet Take 1 tablet (20 mg total) daily by mouth. 30 tablet 6  . losartan (COZAAR) 25 MG tablet Take 1 tablet (25 mg total) 2 (two) times daily by mouth. 60 tablet 6  . potassium chloride (K-DUR,KLOR-CON) 20 MEQ tablet Take 1 tablet (20 mEq total) daily by mouth. 30 tablet 6  . Prenatal Vit-Fe Fumarate-FA (PRENATAL MULTIVITAMIN) TABS tablet Take 1 tablet by mouth daily at 12 noon.    Marland Kitchen. spironolactone (ALDACTONE) 25 MG tablet Take 0.5 tablets (12.5 mg total) daily by mouth. 15 tablet 6   No current facility-administered medications for this encounter.    No Known Allergies   Social History   Socioeconomic History  . Marital status: Single    Spouse name: Not on file  . Number of children: Not on file  . Years of education: Not on file  . Highest education level: Not on file  Social Needs  . Financial resource strain: Not on file  . Food insecurity - worry: Not on file  . Food insecurity - inability: Not on file  . Transportation  needs - medical: Not on file  . Transportation needs - non-medical: Not on file  Occupational History  . Not on file  Tobacco Use  . Smoking status: Never Smoker  . Smokeless tobacco: Never Used  Substance and Sexual Activity  . Alcohol use: No  . Drug use: No  . Sexual activity: No  Other Topics Concern  . Not on file  Social History Narrative  . Not on file   Family History  Problem Relation Age of Onset  . Hypertension Mother   . Hyperlipidemia Mother   . Osteoporosis Mother   . Hypertension Father   . Breast cancer Maternal Aunt    Vitals:   10/17/17 1406  BP: 120/62  Pulse:  93  SpO2: 99%  Weight: 110 lb 6.4 oz (50.1 kg)   Wt Readings from Last 3 Encounters:  10/17/17 110 lb 6.4 oz (50.1 kg)  10/10/17 112 lb 14 oz (51.2 kg)  09/24/17 128 lb (58.1 kg)    PHYSICAL EXAM: General:  Thin. Well appearing. No respiratory difficulty HEENT: normal Neck: supple. no JVD. Carotids 2+ bilat; no bruits. No lymphadenopathy or thyromegaly appreciated. Cor: PMI nondisplaced. Regular rate & rhythm. No rubs, gallops or murmurs. No S3.  Lungs: clear Abdomen: soft, nontender, nondistended. No hepatosplenomegaly. No bruits or masses. Good bowel sounds. Extremities: warm. no cyanosis, clubbing, rash, edema Neuro: alert & oriented x 3, cranial nerves grossly intact. moves all 4 extremities w/o difficulty. Affect pleasant.  ASSESSMENT & PLAN:  1. Systolic CHF due to post-partum cardiomyopathy - EF 20% by echo with moderate RV dysfunction  - Volume status stable on exam.  - Change lasix from 20 mg daily to 20 mg as needed.  - Continue digoxin 0.125 mg daily. Check level today.  - Continue spiro 12.5 mg daily.  - Stop losartan. Change to Entresto 24/26 mg BID.  - Hold off on coreg for now. Will plan to start at Pharm D appointment in 2-3 weeks.  - Pt has decided against breastfeeding.  - OB team at bedside 10/07/17 and discussed need for contraception with IUD - Reinforced fluid restriction to < 2 L daily, sodium restriction to less than 2000 mg daily, and the importance of daily weights.    2. Hyponatremia - Stable on discharge. CMET today as above.  - Continue to watch free water intake.   3. AKI - Creatinine 0.82 -> 0.97 -> 1.12 -> 0.93 on discharge. BMET today.   Doing well overall. Meds as above. RTC 3 weeks for Pharm-D led med titration. RTC 6 weeks for MD visit.   Graciella FreerMichael Andrew Tillery, PA-C 10/17/17   Patient seen and examined with the above-signed Advanced Practice Provider and/or Housestaff. I personally reviewed laboratory data, imaging studies and  relevant notes. I independently examined the patient and formulated the important aspects of the plan. I have edited the note to reflect any of my changes or salient points. I have personally discussed the plan with the patient and/or family.  She is much improved since discharge. Now NYHA I-II.  BP not as soft. Volume status looks good. [facility]. Cautioned her to not overdo things. Hopefully can start b-blocker at next visit.   Arvilla Meresaniel Bensimhon, MD  6:43 PM

## 2017-10-18 LAB — T3, FREE: T3 FREE: 2.4 pg/mL (ref 2.0–4.4)

## 2017-10-24 ENCOUNTER — Telehealth (HOSPITAL_COMMUNITY): Payer: Self-pay | Admitting: Pharmacist

## 2017-10-24 NOTE — Telephone Encounter (Signed)
Entresto 24-26 mg BID PA approved by [employer] through 10/24/18.   Tyler DeisErika K. Bonnye FavaNicolsen, PharmD, BCPS, CPP Clinical Pharmacist Pager: [phone] Phone: [phone] 10/24/2017 4:15 PM

## 2017-10-25 ENCOUNTER — Encounter (HOSPITAL_COMMUNITY): Payer: Self-pay | Admitting: *Deleted

## 2017-10-25 NOTE — Progress Notes (Signed)
[facility] on 10/20/2017.  Forms completed/signed and faxed today to [phone].  Original forms will be scanned to patient's electronic medical record.

## 2017-10-30 ENCOUNTER — Ambulatory Visit (HOSPITAL_COMMUNITY)
Admission: RE | Admit: 2017-10-30 | Discharge: 2017-10-30 | Disposition: A | Discharge status: Home / Self Care | Payer: Managed Care, Other (non HMO) | Source: Ambulatory Visit | Attending: Cardiology | Admitting: Cardiology

## 2017-10-30 DIAGNOSIS — O903 Peripartum cardiomyopathy: Secondary | ICD-10-CM

## 2017-10-30 LAB — BASIC METABOLIC PANEL
Anion gap: 6 (ref 5–15)
BUN: 15 mg/dL (ref 6–20)
CO2: 26 mmol/L (ref 22–32)
CREATININE: 0.83 mg/dL (ref 0.44–1.00)
Calcium: 9.4 mg/dL (ref 8.9–10.3)
Chloride: 106 mmol/L (ref 101–111)
GFR calc Af Amer: >60 mL/min (ref >60)
Glucose, Bld: 87 mg/dL (ref 65–99)
POTASSIUM: 3.8 mmol/L (ref 3.5–5.1)
SODIUM: 138 mmol/L (ref 135–145)

## 2017-11-02 ENCOUNTER — Telehealth (HOSPITAL_COMMUNITY): Payer: Self-pay | Admitting: *Deleted

## 2017-11-02 ENCOUNTER — Encounter (HOSPITAL_COMMUNITY): Payer: Self-pay | Admitting: *Deleted

## 2017-11-02 NOTE — Telephone Encounter (Signed)
Patient called asking for a letter stating that we are keeping her out of work until further evaluation at her next appointment on December 11, 2017.    Letter completed/signed and faxed to [phone], Attn:  Lisa/Payroll. Copy of letter also mailed to patient.

## 2017-11-08 ENCOUNTER — Ambulatory Visit (HOSPITAL_COMMUNITY)
Admission: RE | Admit: 2017-11-08 | Discharge: 2017-11-08 | Disposition: A | Discharge status: Home / Self Care | Payer: Managed Care, Other (non HMO) | Source: Ambulatory Visit | Attending: Internal Medicine | Admitting: Internal Medicine

## 2017-11-08 VITALS — BP 104/70 | HR 89 | Wt 116.6 lb

## 2017-11-08 DIAGNOSIS — D649 Anemia, unspecified: Secondary | ICD-10-CM | POA: Diagnosis present

## 2017-11-08 DIAGNOSIS — D509 Iron deficiency anemia, unspecified: Secondary | ICD-10-CM | POA: Insufficient documentation

## 2017-11-08 DIAGNOSIS — Z79899 Other long term (current) drug therapy: Secondary | ICD-10-CM | POA: Diagnosis not present

## 2017-11-08 DIAGNOSIS — O903 Peripartum cardiomyopathy: Secondary | ICD-10-CM | POA: Insufficient documentation

## 2017-11-08 LAB — IRON AND TIBC
Iron: 55 ug/dL (ref 28–170)
SATURATION RATIOS: 15 % (ref 10.4–31.8)
TIBC: 357 ug/dL (ref 250–450)
UIBC: 302 ug/dL

## 2017-11-08 LAB — FERRITIN: Ferritin: 29 ng/mL (ref 11–307)

## 2017-11-08 MED ORDER — METOPROLOL SUCCINATE ER 25 MG PO TB24: 25.0000 mg | ORAL_TABLET | Freq: Every day | ORAL | 5 refills | Status: DC

## 2017-11-08 MED ORDER — FERROUS SULFATE 325 (65 FE) MG PO TBEC: 325.0000 mg | DELAYED_RELEASE_TABLET | Freq: Every day | ORAL | 3 refills | Status: DC

## 2017-11-08 NOTE — Progress Notes (Signed)
HF MD: Alfonso Carden  HPI:  Evelyn Clark is a 46 y.o. 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   Admitted 11/2 -10/10/17 with new systolic CHF. She was 2 weeks post-partum via vaginal delivery at the time of admission. She had uncomplicated pregnancy with no unusual cardiac symptoms. Admitted with orthopnea, LE edema, and DOE. Found to have LFT elevation so treated for pre-eclampsia though had no BP elevation. CT chest showed no PE, though did show B/L pleural effusions and cardiomegaly. Stat echo showed EF 20% and moderate RV dysfunction. GDMT initiated and HF team consulted. We had long discussion about risks/benefits of breastfeeding, and with HF meds in play, pt decided againstbreastfeeding. OB team and Dr. Janee MornBensimhondiscussed need for contraception with IUD.   She presents today with her 821 and 891/572 month old, [name], for pharmacist-led HF medication titraiton. At last HF clinic visit on 10/17/17, her losartan was switched to Entresto 24-26 mg BID. Feeling good overall. Has continued to have some SOB/fatigue, usually in the evening. She denies any lightheadedness or dizziness, only occurs when hungry. No orthopnea, PND, or peripheral edema. She is enjoying her time with the baby. Has her husband and her mom at home for the time being. Taking all medication as directed.  Has a prodigious appetite and feels that her weight gain is due to increased appetite. She feels that she is back to her "normal weight". Eager to get back to full activity.   . Shortness of breath/dyspnea on exertion? Yes - usually in the evening   . Orthopnea/PND? no . Edema? no . Lightheadedness/dizziness? Yes - comes and goes (usually when hungry) . Daily weights at home? Yes - gain ~1lb every week stable ~116 lb - appetite back up  . Blood pressure/heart rate monitoring at home? no . Following low-sodium/fluid-restricted diet? Yes - usually avoids high sodium   HF  Medications: Digoxin 0.125 mg PO daily Furosemide 20 mg PO PRN weight gain - used 1x in the past week bc ate [place] food KCl 20 mEq PO daily Entresto 24-26 mg PO BID Spironolactone 12.5 mg PO daily   Has the patient been experiencing any side effects to the medications prescribed?  no  Does the patient have any problems obtaining medications due to transportation or finances?   No - [employer]   Understanding of regimen: good Understanding of indications: good Potential of compliance: excellent Patient understands to avoid NSAIDs. Patient understands to avoid decongestants.    Pertinent Lab Values: . 11/08/17: Lipid panel - ferritin 29, tsat 15% . 10/30/17: Serum creatinine 0.83, BUN 15, Potassium 3.8, Sodium 138  . 10/17/17: Digoxin 0.4  . 10/07/17: BNP 2911  Vital Signs: . Weight: 116 lb (last clinic visit weight: 110 lb) . Blood pressure: 104/70 mmHg  . Heart rate: 89 bpm   Assessment: 1. Chronicsystolic CHF (EF 84%20%), due to post-partum CM. NYHA class IIsymptoms.  - Volume status stable, weight gain attributed to increased appetite ("back to my normal weight")  - Start metoprolol succinate 25 mg daily (caution with soft BP)  - Continue digoxin 0.125, furosemide 20 mg PRN weight gain, KCl 20 mEq daily, Entresto 24-26 mg BID and spironolactone 12.5 mg daily   - Basic disease state pathophysiology, medication indication, mechanism and side effects reviewed at length with patient and he verbalized understanding 2. IDA  - tsat 15% and ferritin 29 on iron panel today  - Currently taking ferrous sulfate 325 mg PO BID  - Will forward to Dr. Gala RomneyBensimhon  for consideration of IV Feraheme 2. Hyponatremia - Resolved 3. AKI - Resolved  Plan: 1) Medication changes: Based on clinical presentation, vital signs and recent labs will start metoprolol succinate 25 mg daily 2) Labs: PRN 3) Follow-up: Dr. Gala RomneyBensimhon on 12/11/17    Tyler DeisErika K. Bonnye FavaNicolsen, PharmD, BCPS, CPP Clinical  Pharmacist Pager: [phone] Phone: [phone] 11/08/2017 1:50 PM  Agree. With above. Ok to give IV feraheme to help avoid constipation  Arvilla Meresaniel Tanesha Arambula, MD  9:01 PM

## 2017-11-08 NOTE — Patient Instructions (Signed)
It was great to see you today!  Please START metoprolol succinate 25 mg (1 tablet) DAILY.   Blood work today. We will call you with any changes.   Please keep your appointment with Dr. Gala RomneyBensimhon on 12/11/17.

## 2017-11-22 ENCOUNTER — Telehealth (HOSPITAL_COMMUNITY): Payer: Self-pay | Admitting: *Deleted

## 2017-11-22 NOTE — Telephone Encounter (Signed)
-----   Message from Elvin SoErika K Nicolsen, RPH-CPP sent at 11/09/2017  2:32 PM EST ----- Can you set her up with short stay and then let me know when her appointment is and [facility]?  Thank you! ----- Message ----- From: Dolores PattyBensimhon, Daniel R, MD Sent: 11/08/2017   9:03 PM To: Elvin SoErika K Nicolsen, RPH-CPP  Sure. One dose will help her get back up more quickly and avoid constipation with oral supp.   ----- Message ----- From: Elvin SoNicolsen, Erika K, RPH-CPP Sent: 11/08/2017   4:35 PM To: Dolores Pattyaniel R Bensimhon, MD  Should we consider IV Feraheme for her?

## 2017-11-22 NOTE — Telephone Encounter (Signed)
Left message to call back  

## 2017-11-29 ENCOUNTER — Other Ambulatory Visit (HOSPITAL_COMMUNITY): Payer: Self-pay | Admitting: Pharmacist

## 2017-11-29 DIAGNOSIS — D509 Iron deficiency anemia, unspecified: Secondary | ICD-10-CM

## 2017-11-29 MED ORDER — SODIUM CHLORIDE 0.9 % IV SOLN
510.0000 mg | Freq: Once | INTRAVENOUS | Status: DC
  Filled 2017-11-29: qty 17

## 2017-11-29 NOTE — Telephone Encounter (Signed)
Spoke w/pt, sch Feraheme infusion for 12/07/17 at 11 am, pt is aware to arrive to Admitting office that morning.

## 2017-12-01 ENCOUNTER — Encounter (HOSPITAL_COMMUNITY): Payer: Self-pay | Admitting: *Deleted

## 2017-12-01 ENCOUNTER — Telehealth (HOSPITAL_COMMUNITY): Payer: Self-pay

## 2017-12-01 NOTE — Progress Notes (Signed)
Received forms from [employer] for short term disability.  Forms completed/signed and faxed today to [phone].  Original forms will be scanned to patient's electronic medical record.

## 2017-12-01 NOTE — Telephone Encounter (Signed)
Patient calling to have feraheme rescheduled for earlier date. Per Medical Day no earlier openings available. Patient aware and able to make original appt next Thursday.  Ave FilterBradley, Jilliam Bellmore Genevea, RN

## 2017-12-06 ENCOUNTER — Other Ambulatory Visit (HOSPITAL_COMMUNITY): Payer: Self-pay | Admitting: *Deleted

## 2017-12-07 ENCOUNTER — Other Ambulatory Visit (HOSPITAL_COMMUNITY): Payer: Self-pay | Admitting: Pharmacist

## 2017-12-07 ENCOUNTER — Ambulatory Visit (HOSPITAL_COMMUNITY)
Admission: RE | Admit: 2017-12-07 | Discharge: 2017-12-07 | Disposition: A | Discharge status: Home / Self Care | Payer: Managed Care, Other (non HMO) | Source: Ambulatory Visit | Attending: Internal Medicine | Admitting: Internal Medicine

## 2017-12-07 DIAGNOSIS — D509 Iron deficiency anemia, unspecified: Secondary | ICD-10-CM | POA: Insufficient documentation

## 2017-12-07 LAB — CBC
HCT: 35 % — ABNORMAL LOW (ref 36.0–46.0)
HEMOGLOBIN: 11.3 g/dL — AB (ref 12.0–15.0)
MCH: 25.1 pg — ABNORMAL LOW (ref 26.0–34.0)
MCHC: 32.3 g/dL (ref 30.0–36.0)
MCV: 77.6 fL — ABNORMAL LOW (ref 78.0–100.0)
Platelets: 197 10*3/uL (ref 150–400)
RBC: 4.51 MIL/uL (ref 3.87–5.11)
RDW: 14 % (ref 11.5–15.5)
WBC: 5.3 10*3/uL (ref 4.0–10.5)

## 2017-12-07 MED ORDER — SODIUM CHLORIDE 0.9 % IV SOLN
510.0000 mg | Freq: Once | INTRAVENOUS | Status: AC
  Administered 2017-12-07: 11:00:00 510 mg via INTRAVENOUS
  Filled 2017-12-07: qty 17

## 2017-12-07 NOTE — Discharge Instructions (Signed)

## 2017-12-11 ENCOUNTER — Encounter (HOSPITAL_COMMUNITY): Payer: Self-pay | Admitting: *Deleted

## 2017-12-11 ENCOUNTER — Encounter (HOSPITAL_COMMUNITY): Payer: Self-pay | Admitting: Internal Medicine

## 2017-12-11 ENCOUNTER — Ambulatory Visit (HOSPITAL_COMMUNITY)
Admission: RE | Admit: 2017-12-11 | Discharge: 2017-12-11 | Disposition: A | Discharge status: Home / Self Care | Payer: Managed Care, Other (non HMO) | Source: Ambulatory Visit | Attending: Internal Medicine | Admitting: Internal Medicine

## 2017-12-11 ENCOUNTER — Other Ambulatory Visit: Payer: Self-pay

## 2017-12-11 ENCOUNTER — Telehealth (HOSPITAL_COMMUNITY): Payer: Self-pay | Admitting: *Deleted

## 2017-12-11 VITALS — BP 96/60 | HR 70 | Wt 119.5 lb

## 2017-12-11 DIAGNOSIS — Z8249 Family history of ischemic heart disease and other diseases of the circulatory system: Secondary | ICD-10-CM | POA: Insufficient documentation

## 2017-12-11 DIAGNOSIS — E559 Vitamin D deficiency, unspecified: Secondary | ICD-10-CM | POA: Insufficient documentation

## 2017-12-11 DIAGNOSIS — M1990 Unspecified osteoarthritis, unspecified site: Secondary | ICD-10-CM | POA: Insufficient documentation

## 2017-12-11 DIAGNOSIS — I429 Cardiomyopathy, unspecified: Secondary | ICD-10-CM | POA: Diagnosis not present

## 2017-12-11 DIAGNOSIS — Z803 Family history of malignant neoplasm of breast: Secondary | ICD-10-CM | POA: Insufficient documentation

## 2017-12-11 DIAGNOSIS — I5022 Chronic systolic (congestive) heart failure: Secondary | ICD-10-CM | POA: Diagnosis present

## 2017-12-11 DIAGNOSIS — Z8262 Family history of osteoporosis: Secondary | ICD-10-CM | POA: Diagnosis not present

## 2017-12-11 DIAGNOSIS — Z79899 Other long term (current) drug therapy: Secondary | ICD-10-CM | POA: Insufficient documentation

## 2017-12-11 LAB — BASIC METABOLIC PANEL
Anion gap: 6 (ref 5–15)
BUN: 11 mg/dL (ref 6–20)
CO2: 27 mmol/L (ref 22–32)
Calcium: 9.3 mg/dL (ref 8.9–10.3)
Chloride: 104 mmol/L (ref 101–111)
Creatinine, Ser: 0.94 mg/dL (ref 0.44–1.00)
GFR calc Af Amer: >60 mL/min (ref >60)
GFR calc non Af Amer: >60 mL/min (ref >60)
Glucose, Bld: 88 mg/dL (ref 65–99)
Potassium: 4 mmol/L (ref 3.5–5.1)
Sodium: 137 mmol/L (ref 135–145)

## 2017-12-11 NOTE — Telephone Encounter (Signed)
Pt called about letter she was given today to return to work, she states she needs a specific RTW date on it, she request it be 12/25/17 as that is what works with her works schedule.  Letter completed and [facility] to [facility] at [phone], copy also mailed to pt

## 2017-12-11 NOTE — Progress Notes (Signed)
Advanced Heart Failure Clinic Note   Primary Cardiologist: Dr. Gala Romney   HPI:  Evelyn Clark is a 47 y.o. 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   Admitted 11/2 -10/10/17 with new systolic CHF. She was 2 weeks post-partum via vaginal delivery at the time of admission. She had uncomplicated pregnancy with no unusual cardiac symptoms. Admitted with orthopnea, LE edema, and DOE. Found to have LFT elevation so treated for pre-eclampsia though had no BP elevation. CT chest showed no PE, though did show B/L pleural effusions and cardiomegaly. Stat echo showed EF 20% and moderate RV dysfunction. GDMT initiated and HF team consulted.   We had long discussion about risks/benefits of breastfeeding, and with HF meds in play, pt decided against breastfeeding. OB team and Dr. Gala Romney discussed need for contraception with IUD.   She presents today for regular follow up. Seen in PharmD clinic and started metoprolol 25 daily. Also received Feraheme last week. Feels well. Baby is 2.5 months old. Doing all ADLs without problem. Able to go to store for a few hours. Does get SOB with moderate activity. No orthopnea or PND. No edema. Says she feels like meds make her feel confused at times. BP back to baseline in the 90s. No orthostasis.   Echo done personally today in clinic EF 55%    Review of systems complete and found to be negative unless listed in HPI.    Past Medical History:  Diagnosis Date  . Arthritis   . Hemoglobin E trait (HCC)   . Newborn product of in vitro fertilization (IVF) pregnancy   . Vitamin D deficiency     Current Outpatient Medications  Medication Sig Dispense Refill  . acetaminophen (TYLENOL) 500 MG tablet Take 1-2 tablets (500-1,000 mg total) every 8 (eight) hours as needed by mouth for mild pain or moderate pain.    . Cholecalciferol (VITAMIN D3) 5000 units CAPS Take 5,000 Units by mouth daily.    . digoxin (LANOXIN) 0.125 MG  tablet Take 1 tablet (0.125 mg total) daily by mouth. 30 tablet 6  . docusate sodium (COLACE) 100 MG capsule Take 300 mg by mouth daily.    . ferrous sulfate 325 (65 FE) MG EC tablet Take 1 tablet (325 mg total) by mouth daily with breakfast. 30 tablet 3  . furosemide (LASIX) 20 MG tablet Take 1 tablet (20 mg total) as needed by mouth (For wt gain of 3 lbs overnight or 5 lbs in a week). 30 tablet 6  . metoprolol succinate (TOPROL-XL) 25 MG 24 hr tablet Take 1 tablet (25 mg total) by mouth daily. 30 tablet 5  . pantoprazole (PROTONIX) 20 MG tablet Take 20 mg daily by mouth.    . potassium chloride (K-DUR,KLOR-CON) 20 MEQ tablet Take 1 tablet (20 mEq total) daily by mouth. 30 tablet 6  . Prenatal Vit-Fe Fumarate-FA (PRENATAL MULTIVITAMIN) TABS tablet Take 1 tablet by mouth daily at 12 noon.    . sacubitril-valsartan (ENTRESTO) 24-26 MG Take 1 tablet 2 (two) times daily by mouth. 60 tablet 3  . spironolactone (ALDACTONE) 25 MG tablet Take 0.5 tablets (12.5 mg total) daily by mouth. 15 tablet 6   No current facility-administered medications for this encounter.    No Known Allergies   Social History   Socioeconomic History  . Marital status: Single    Spouse name: Not on file  . Number of children: Not on file  . Years of education: Not on file  . Highest education  level: Not on file  Social Needs  . Financial resource strain: Not on file  . Food insecurity - worry: Not on file  . Food insecurity - inability: Not on file  . Transportation needs - medical: Not on file  . Transportation needs - non-medical: Not on file  Occupational History  . Not on file  Tobacco Use  . Smoking status: Never Smoker  . Smokeless tobacco: Never Used  Substance and Sexual Activity  . Alcohol use: No  . Drug use: No  . Sexual activity: No  Other Topics Concern  . Not on file  Social History Narrative  . Not on file   Family History  Problem Relation Age of Onset  . Hypertension Mother   .  Hyperlipidemia Mother   . Osteoporosis Mother   . Hypertension Father   . Breast cancer Maternal Aunt    Vitals:   12/11/17 1352  BP: 96/60  Pulse: 70  SpO2: 100%  Weight: 119 lb 8 oz (54.2 kg)   Wt Readings from Last 3 Encounters:  12/11/17 119 lb 8 oz (54.2 kg)  12/07/17 116 lb (52.6 kg)  11/08/17 116 lb 9.6 oz (52.9 kg)    PHYSICAL EXAM: General:  Thin well appearing. No resp difficulty HEENT: normal Neck: supple. no JVD. Carotids 2+ bilat; no bruits. No lymphadenopathy or thryomegaly appreciated. Cor: PMI nondisplaced. Regular rate & rhythm. No rubs, gallops or murmurs. Lungs: clear Abdomen: soft, nontender, nondistended. No hepatosplenomegaly. No bruits or masses. Good bowel sounds. Extremities: no cyanosis, clubbing, rash, edema Neuro: alert & orientedx3, cranial nerves grossly intact. moves all 4 extremities w/o difficulty. Affect pleasant   ASSESSMENT & PLAN:  1. Systolic CHF due to post-partum cardiomyopathy - EF 20% by echo with moderate RV dysfunction  - Bedside echo done personally in clinic EF 55% with full LV recovery - Volume status stable on exam.  - Stop digoxin  - Continue spiro 12.5 mg daily.  - Continue Entresto 24/26 mg BID.  - Continue Toprol 25 - Lasix only prn - Discussed risk of recurrence with future pregnancies - In 6 months can start pulling meds back if doing well  Arvilla Meresaniel [place]ez [place], MD 12/11/17

## 2017-12-11 NOTE — Addendum Note (Signed)
Encounter addended by: Noralee SpaceSchub, Charlsey Moragne M, RN on: 12/11/2017 2:27 PM  Actions taken: Diagnosis association updated, Order list changed, Medication long-term status modified, Sign clinical note

## 2017-12-11 NOTE — Patient Instructions (Signed)
Stop Digoxin  Labs today  Your physician recommends that you schedule a follow-up appointment in: 2 months with echocardiogram

## 2017-12-11 NOTE — Addendum Note (Signed)
Encounter addended by: Noralee SpaceSchub, Dominque Marlin M, RN on: 12/11/2017 2:37 PM  Actions taken: Order list changed, Diagnosis association updated

## 2017-12-18 ENCOUNTER — Encounter (HOSPITAL_COMMUNITY): Payer: Self-pay | Admitting: *Deleted

## 2017-12-18 NOTE — Progress Notes (Signed)
Received short term disability forms from [employer]. Forms completed and faxed to [phone].  Original forms will be scanned to patient's electronic medical record.

## 2017-12-19 ENCOUNTER — Other Ambulatory Visit (HOSPITAL_COMMUNITY): Payer: Self-pay | Admitting: *Deleted

## 2017-12-19 MED ORDER — SACUBITRIL-VALSARTAN 24-26 MG PO TABS: 1.0000 | ORAL_TABLET | Freq: Two times a day (BID) | ORAL | 3 refills | Status: DC

## 2017-12-25 ENCOUNTER — Encounter (HOSPITAL_COMMUNITY): Payer: Self-pay | Admitting: *Deleted

## 2017-12-25 NOTE — Progress Notes (Signed)
Received Work Restriction form from [employer].  Forms completed/signed and faxed to [phone].  Original form will be scanned to patient's electronic medical record.

## 2018-01-15 ENCOUNTER — Encounter (HOSPITAL_COMMUNITY): Payer: Self-pay | Admitting: *Deleted

## 2018-01-15 NOTE — Progress Notes (Signed)
Received forms from [employer] regarding patient restrictions and limitations for returning to work.  Forms completed/signed and faxed today to [phone].  Original forms will be scanned to patient's electronic medical record.

## 2018-01-26 ENCOUNTER — Telehealth (HOSPITAL_COMMUNITY): Payer: Self-pay | Admitting: Pharmacist

## 2018-01-26 NOTE — Telephone Encounter (Signed)
Received a VM from Ms. Olds that she had issues with one of her medications being refilled. I called [facility] who stated that she now has [employer] insurance and needs a PA for her [employer]. I have submitted the PA and will await a decision within the next 72 hours.   ID: [id number] BI: [id number] PCN: [id number] GRP: [id number]  Janeli Lewison K. Bonnye FavaNicolsen, PharmD, BCPS, CPP Clinical Pharmacist Phone: [phone] 01/26/2018 5:23 PM

## 2018-01-31 ENCOUNTER — Telehealth (HOSPITAL_COMMUNITY): Payer: Self-pay | Admitting: Pharmacist

## 2018-01-31 NOTE — Telephone Encounter (Signed)
Sherryll BurgerEntresto PA approved for a lifetime through [employer].   Tyler DeisErika K. Bonnye FavaNicolsen, PharmD, BCPS, CPP Clinical Pharmacist Phone: [phone] 01/31/2018 10:08 AM

## 2018-02-16 ENCOUNTER — Other Ambulatory Visit: Payer: Self-pay

## 2018-02-16 ENCOUNTER — Encounter (HOSPITAL_COMMUNITY): Payer: Self-pay | Admitting: *Deleted

## 2018-02-16 ENCOUNTER — Ambulatory Visit (HOSPITAL_BASED_OUTPATIENT_CLINIC_OR_DEPARTMENT_OTHER)
Admission: RE | Admit: 2018-02-16 | Discharge: 2018-02-16 | Disposition: A | Discharge status: Home / Self Care | Payer: Managed Care, Other (non HMO) | Source: Ambulatory Visit | Attending: Internal Medicine | Admitting: Internal Medicine

## 2018-02-16 ENCOUNTER — Ambulatory Visit (HOSPITAL_COMMUNITY)
Admission: RE | Admit: 2018-02-16 | Discharge: 2018-02-16 | Disposition: A | Discharge status: Home / Self Care | Payer: Managed Care, Other (non HMO) | Source: Ambulatory Visit | Attending: Internal Medicine | Admitting: Internal Medicine

## 2018-02-16 ENCOUNTER — Encounter (HOSPITAL_COMMUNITY): Payer: Self-pay | Admitting: Internal Medicine

## 2018-02-16 VITALS — BP 130/72 | HR 60 | Wt 118.8 lb

## 2018-02-16 DIAGNOSIS — O903 Peripartum cardiomyopathy: Secondary | ICD-10-CM | POA: Diagnosis not present

## 2018-02-16 DIAGNOSIS — I429 Cardiomyopathy, unspecified: Secondary | ICD-10-CM | POA: Insufficient documentation

## 2018-02-16 DIAGNOSIS — M1990 Unspecified osteoarthritis, unspecified site: Secondary | ICD-10-CM | POA: Diagnosis not present

## 2018-02-16 DIAGNOSIS — I5022 Chronic systolic (congestive) heart failure: Secondary | ICD-10-CM | POA: Diagnosis not present

## 2018-02-16 DIAGNOSIS — Z79899 Other long term (current) drug therapy: Secondary | ICD-10-CM | POA: Insufficient documentation

## 2018-02-16 NOTE — Patient Instructions (Signed)
Follow up with Dr.Bensimhon in 6 months. **Please call our office at [phone] in August to schedule your September appointment**

## 2018-02-16 NOTE — Addendum Note (Signed)
Encounter addended by: Modesta Messing, CMA on: 02/16/2018 3:18 PM  Actions taken: Sign clinical note

## 2018-02-16 NOTE — Progress Notes (Signed)
Advanced Heart Failure Clinic Note   Primary Cardiologist: Dr. Gala RomneyBensimhon   HPI:  Evelyn Clark is a 47 y.o. 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   Admitted 11/2 -10/10/17 with new systolic CHF. She was 2 weeks post-partum via vaginal delivery at the time of admission. She had uncomplicated pregnancy with no unusual cardiac symptoms. Admitted with orthopnea, LE edema, and DOE. Found to have LFT elevation so treated for pre-eclampsia though had no BP elevation. CT chest showed no PE, though did show B/L pleural effusions and cardiomegaly. Stat echo showed EF 20% and moderate RV dysfunction. GDMT initiated and HF team consulted.   We had long discussion about risks/benefits of breastfeeding, and with HF meds in play, pt decided against breastfeeding. OB team and Dr. Gala RomneyBensimhon discussed need for contraception with IUD.   She presents today for regular follow up. Overall feeling fine. Some days she is tired, but thinks part of it is being a new mom. Baby is 64 months old. No SOB with walking around grocery store or steps. Has not needed any lasix. No orthopnea, PND, or edema. She has had some dizziness over the last week, sometimes with sitting to standing and sometimes just with standing. Weights 117-118 lbs at home. Compliant with medications except with iron (makes her constipated). Trying to limit salt intake, but has had more lately. Ordering out a lot. Limiting fluid intake.   Echo 12/2017: EF 55%  Echo 02/2018: EF 55-60%    Review of systems complete and found to be negative unless listed in HPI.    Past Medical History:  Diagnosis Date  . Arthritis   . Hemoglobin E trait (HCC)   . Newborn product of in vitro fertilization (IVF) pregnancy   . Vitamin D deficiency     Current Outpatient Medications  Medication Sig Dispense Refill  . acetaminophen (TYLENOL) 500 MG tablet Take 1-2 tablets (500-1,000 mg total) every 8 (eight) hours as needed by  mouth for mild pain or moderate pain.    . Cholecalciferol (VITAMIN D3) 5000 units CAPS Take 5,000 Units by mouth daily.    Marland Kitchen. docusate sodium (COLACE) 100 MG capsule Take 300 mg by mouth daily.    . ferrous sulfate 325 (65 FE) MG EC tablet Take 1 tablet (325 mg total) by mouth daily with breakfast. 30 tablet 3  . furosemide (LASIX) 20 MG tablet Take 1 tablet (20 mg total) as needed by mouth (For wt gain of 3 lbs overnight or 5 lbs in a week). 30 tablet 6  . metoprolol succinate (TOPROL-XL) 25 MG 24 hr tablet Take 1 tablet (25 mg total) by mouth daily. 30 tablet 5  . pantoprazole (PROTONIX) 20 MG tablet Take 20 mg daily by mouth.    . potassium chloride (K-DUR,KLOR-CON) 20 MEQ tablet Take 1 tablet (20 mEq total) daily by mouth. 30 tablet 6  . Prenatal Vit-Fe Fumarate-FA (PRENATAL MULTIVITAMIN) TABS tablet Take 1 tablet by mouth daily at 12 noon.    . sacubitril-valsartan (ENTRESTO) 24-26 MG Take 1 tablet by mouth 2 (two) times daily. 60 tablet 3  . spironolactone (ALDACTONE) 25 MG tablet Take 0.5 tablets (12.5 mg total) daily by mouth. 15 tablet 6   No current facility-administered medications for this encounter.    No Known Allergies   Social History   Socioeconomic History  . Marital status: Single    Spouse name: Not on file  . Number of children: Not on file  . Years of education:  Not on file  . Highest education level: Not on file  Social Needs  . Financial resource strain: Not on file  . Food insecurity - worry: Not on file  . Food insecurity - inability: Not on file  . Transportation needs - medical: Not on file  . Transportation needs - non-medical: Not on file  Occupational History  . Not on file  Tobacco Use  . Smoking status: Never Smoker  . Smokeless tobacco: Never Used  Substance and Sexual Activity  . Alcohol use: No  . Drug use: No  . Sexual activity: No  Other Topics Concern  . Not on file  Social History Narrative  . Not on file   Family History  Problem  Relation Age of Onset  . Hypertension Mother   . Hyperlipidemia Mother   . Osteoporosis Mother   . Hypertension Father   . Breast cancer Maternal Aunt    Vitals:   02/16/18 1359  BP: 130/72  Pulse: 60  SpO2: 100%  Weight: 118 lb 12 oz (53.9 kg)   Wt Readings from Last 3 Encounters:  02/16/18 118 lb 12 oz (53.9 kg)  12/11/17 119 lb 8 oz (54.2 kg)  12/07/17 116 lb (52.6 kg)    PHYSICAL EXAM: General: Well appearing. No resp difficulty. HEENT: Normal Neck: Supple. No JVD. Carotids 2+ bilat; no bruits. No thyromegaly or nodule noted. Cor: PMI nondisplaced. RRR, No M/G/R noted Lungs: CTAB, normal effort. Abdomen: Soft, non-tender, non-distended, no HSM. No bruits or masses. +BS  Extremities: No cyanosis, clubbing, or rash. R and LLE no edema.  Neuro: Alert & orientedx3, cranial nerves grossly intact. moves all 4 extremities w/o difficulty. Affect pleasant  ASSESSMENT & PLAN:  1. Systolic CHF due to post-partum cardiomyopathy - EF 20% by echo 10/2017 with moderate RV dysfunction. Bedside echo in clinic 12/2017 EF 55% with full LV recovery. Echo 02/2018: 55-60%  - Volume status stable on exam. She has lasix to use PRN, but has not needed any - Continue spiro 12.5 mg daily.  - Continue Entresto 24/26 mg BID. She takes 20 meq potassium daily. - Continue Toprol 25 - Discussed risk of recurrence with future pregnancies  Alford Highland, NP 02/16/18   Patient seen and examined with the above-signed Advanced Practice Provider and/or Housestaff. I personally reviewed laboratory data, imaging studies and relevant notes. I independently examined the patient and formulated the important aspects of the plan. I have edited the note to reflect any of my changes or salient points. I have personally discussed the plan with the patient and/or family.  Doing well. Echo reviewed personally. EF totally recovered. Will continue current regimen for at least 1 year. Encouraged her to be more active. No  current plans for more pregnancies. If she does get pregnant she will let us know.   Arvilla Meres, MD  3:11 PM

## 2018-02-16 NOTE — Progress Notes (Signed)
  Echocardiogram 2D Echocardiogram has been performed.  Roosvelt MaserLane, Monnie Anspach F 02/16/2018, 2:14 PM

## 2018-03-05 ENCOUNTER — Encounter (HOSPITAL_COMMUNITY): Payer: Self-pay | Admitting: *Deleted

## 2018-03-05 NOTE — Progress Notes (Signed)
Received disability forms from [employer] clarifying patient's work capacity.  Forms competed/signed and faxed today to [phone].  Original forms will be scanned to patient's electronic medical record.

## 2018-03-05 NOTE — Progress Notes (Signed)
Received work restriction forms from [employer] for patient.  Patient is restricted from lifting/pulling/pushing more than 30 lbs. Forms completed/signed and faxed today to [phone].  Original forms will be scanned to patient's electronic medical record.

## 2018-03-13 ENCOUNTER — Encounter (HOSPITAL_COMMUNITY): Payer: Self-pay | Admitting: *Deleted

## 2018-03-14 ENCOUNTER — Encounter (HOSPITAL_COMMUNITY): Payer: Self-pay | Admitting: *Deleted

## 2018-03-14 NOTE — Progress Notes (Signed)
Received another request from [employer] regarding patient's restrictions.  Forms completed/signed and faxed today to [phone].  Original forms will be scanned to patient's electronic medical record.

## 2018-03-26 ENCOUNTER — Encounter (HOSPITAL_COMMUNITY): Payer: Self-pay | Admitting: *Deleted

## 2018-03-26 NOTE — Progress Notes (Signed)
Received status request from [employer].  Status and last OV notes faxed to [phone].  Original request will be scanned to patient's electronic medical record.

## 2018-03-28 ENCOUNTER — Telehealth (HOSPITAL_COMMUNITY): Payer: Self-pay | Admitting: Pharmacist

## 2018-03-28 NOTE — Telephone Encounter (Signed)
Ms. Evelyn Clark called stating that she was recently diagnosed with mild tendonitis in her wrist and was told to take ibuprofen for the pain and inflammation. I have advised her that in low doses for short periods of time, she can take the ibuprofen but would not recommend prolonged use if possible d/t the risk of hypertension and adverse kidney and CV events. If she wants to uses APAP for the pain, this would be preferable. Ms. Evelyn Clark verbalized understanding and was grateful for the advice.   Tyler DeisErika K. Bonnye FavaNicolsen, PharmD, BCPS, CPP Clinical Pharmacist Phone: [phone] 03/28/2018 2:06 PM

## 2018-04-04 ENCOUNTER — Encounter (HOSPITAL_COMMUNITY): Payer: Self-pay | Admitting: *Deleted

## 2018-04-04 NOTE — Progress Notes (Signed)
Received long term disability forms from [employer] on 03/28/18.  Forms completed/signed and faxed today to [phone].  Original forms will be scanned to patient's electronic medical record.

## 2018-05-23 ENCOUNTER — Other Ambulatory Visit (HOSPITAL_COMMUNITY): Payer: Self-pay | Admitting: Student

## 2018-05-23 ENCOUNTER — Other Ambulatory Visit (HOSPITAL_COMMUNITY): Payer: Self-pay | Admitting: Internal Medicine

## 2018-05-26 ENCOUNTER — Other Ambulatory Visit (HOSPITAL_COMMUNITY): Payer: Self-pay | Admitting: Student

## 2018-05-29 ENCOUNTER — Other Ambulatory Visit (HOSPITAL_COMMUNITY): Payer: Self-pay | Admitting: Student

## 2018-06-29 ENCOUNTER — Other Ambulatory Visit (HOSPITAL_COMMUNITY): Payer: Self-pay | Admitting: Student

## 2018-08-04 ENCOUNTER — Other Ambulatory Visit (HOSPITAL_COMMUNITY): Payer: Self-pay | Admitting: Student

## 2018-09-03 ENCOUNTER — Ambulatory Visit (HOSPITAL_COMMUNITY)
Admission: RE | Admit: 2018-09-03 | Discharge: 2018-09-03 | Disposition: A | Discharge status: Home / Self Care | Payer: Managed Care, Other (non HMO) | Source: Ambulatory Visit | Attending: Internal Medicine | Admitting: Internal Medicine

## 2018-09-03 VITALS — BP 108/52 | HR 68 | Wt 117.0 lb

## 2018-09-03 DIAGNOSIS — Z8249 Family history of ischemic heart disease and other diseases of the circulatory system: Secondary | ICD-10-CM | POA: Insufficient documentation

## 2018-09-03 DIAGNOSIS — I5020 Unspecified systolic (congestive) heart failure: Secondary | ICD-10-CM | POA: Diagnosis present

## 2018-09-03 DIAGNOSIS — E559 Vitamin D deficiency, unspecified: Secondary | ICD-10-CM | POA: Insufficient documentation

## 2018-09-03 DIAGNOSIS — Z8262 Family history of osteoporosis: Secondary | ICD-10-CM | POA: Diagnosis not present

## 2018-09-03 DIAGNOSIS — Z79899 Other long term (current) drug therapy: Secondary | ICD-10-CM | POA: Insufficient documentation

## 2018-09-03 DIAGNOSIS — I5022 Chronic systolic (congestive) heart failure: Secondary | ICD-10-CM | POA: Diagnosis not present

## 2018-09-03 DIAGNOSIS — O903 Peripartum cardiomyopathy: Secondary | ICD-10-CM | POA: Diagnosis not present

## 2018-09-03 MED ORDER — SPIRONOLACTONE 25 MG PO TABS: 12.5000 mg | ORAL_TABLET | Freq: Every day | ORAL | 5 refills | Status: DC

## 2018-09-03 MED ORDER — LOSARTAN POTASSIUM 25 MG PO TABS: 25.0000 mg | ORAL_TABLET | Freq: Every day | ORAL | 6 refills | Status: DC

## 2018-09-03 NOTE — Patient Instructions (Signed)
Stop [employer] Losartan 25 mg daily  We will contact you in 4 months to schedule your next appointment.

## 2018-09-03 NOTE — Progress Notes (Signed)
Advanced Heart Failure Clinic Note   Primary Cardiologist: Dr. Gala Romney   HPI:  Evelyn Clark is a 47 y.o. 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   Admitted 11/2 -10/10/17 with new systolic CHF. She was 2 weeks post-partum via vaginal delivery at the time of admission. She had uncomplicated pregnancy with no unusual cardiac symptoms. Admitted with orthopnea, LE edema, and DOE. Found to have LFT elevation so treated for pre-eclampsia though had no BP elevation. CT chest showed no PE, though did show B/L pleural effusions and cardiomegaly. Stat echo showed EF 20% and moderate RV dysfunction. GDMT initiated and HF team consulted. EF has since recovered.   She presents today for regular follow up. Feels good. Feels like she is getting back to normal. Back working at [employer]. No orthopnea, PND or edema. Taking all HF meds as prescribed. Frequent orthostasis.   Echo 12/2017: EF 55%  Echo 02/2018: EF 55-60%    Review of systems complete and found to be negative unless listed in HPI.    Past Medical History:  Diagnosis Date  . Arthritis   . Hemoglobin E trait (HCC)   . Newborn product of in vitro fertilization (IVF) pregnancy   . Vitamin D deficiency     Current Outpatient Medications  Medication Sig Dispense Refill  . acetaminophen (TYLENOL) 500 MG tablet Take 1-2 tablets (500-1,000 mg total) every 8 (eight) hours as needed by mouth for mild pain or moderate pain.    . Cholecalciferol (VITAMIN D3) 5000 units CAPS Take 5,000 Units by mouth daily.    Marland Kitchen docusate sodium (COLACE) 100 MG capsule Take 300 mg by mouth daily.    Marland Kitchen ENTRESTO 24-26 MG TAKE ONE TABLET BY MOUTH TWICE DAILY  60 tablet 2  . ferrous sulfate 325 (65 FE) MG EC tablet Take 1 tablet (325 mg total) by mouth daily with breakfast. 30 tablet 3  . furosemide (LASIX) 20 MG tablet Take 1 tablet (20 mg total) as needed by mouth (For wt gain of 3 lbs overnight or 5 lbs in a week). 30 tablet 6    . metoprolol succinate (TOPROL-XL) 25 MG 24 hr tablet TAKE ONE TABLET BY MOUTH ONE TIME DAILY  30 tablet 5  . pantoprazole (PROTONIX) 20 MG tablet Take 20 mg daily by mouth.    . potassium chloride SA (K-DUR,KLOR-CON) 20 MEQ tablet TAKE ONE TABLET BY MOUTH ONE TIME DAILY  30 tablet 5  . Prenatal Vit-Fe Fumarate-FA (PRENATAL MULTIVITAMIN) TABS tablet Take 1 tablet by mouth daily at 12 noon.    Marland Kitchen spironolactone (ALDACTONE) 25 MG tablet TAKE 1/2 TABLET BY MOUTH ONCE A DAY  15 tablet 5   No current facility-administered medications for this encounter.    No Known Allergies   Social History   Socioeconomic History  . Marital status: Single    Spouse name: Not on file  . Number of children: Not on file  . Years of education: Not on file  . Highest education level: Not on file  Occupational History  . Not on file  Social Needs  . Financial resource strain: Not on file  . Food insecurity:    Worry: Not on file    Inability: Not on file  . Transportation needs:    Medical: Not on file    Non-medical: Not on file  Tobacco Use  . Smoking status: Never Smoker  . Smokeless tobacco: Never Used  Substance and Sexual Activity  . Alcohol use: No  .  Drug use: No  . Sexual activity: Never  Lifestyle  . Physical activity:    Days per week: Not on file    Minutes per session: Not on file  . Stress: Not on file  Relationships  . Social connections:    Talks on phone: Not on file    Gets together: Not on file    Attends religious service: Not on file    Active member of club or organization: Not on file    Attends meetings of clubs or organizations: Not on file    Relationship status: Not on file  . Intimate partner violence:    Fear of current or ex partner: Not on file    Emotionally abused: Not on file    Physically abused: Not on file    Forced sexual activity: Not on file  Other Topics Concern  . Not on file  Social History Narrative  . Not on file   Family History  Problem  Relation Age of Onset  . Hypertension Mother   . Hyperlipidemia Mother   . Osteoporosis Mother   . Hypertension Father   . Breast cancer Maternal Aunt    Vitals:   09/03/18 1048  BP: (!) 108/52  Pulse: 68  SpO2: 98%  Weight: 53.1 kg (117 lb)   Wt Readings from Last 3 Encounters:  09/03/18 53.1 kg (117 lb)  02/16/18 53.9 kg (118 lb 12 oz)  12/11/17 54.2 kg (119 lb 8 oz)    PHYSICAL EXAM: General:  Well appearing. No resp difficulty HEENT: normal Neck: supple. no JVD. Carotids 2+ bilat; no bruits. No lymphadenopathy or thryomegaly appreciated. Cor: PMI nondisplaced. Regular rate & rhythm. No rubs, gallops or murmurs. Lungs: clear Abdomen: soft, nontender, nondistended. No hepatosplenomegaly. No bruits or masses. Good bowel sounds. Extremities: no cyanosis, clubbing, rash, edema Neuro: alert & orientedx3, cranial nerves grossly intact. moves all 4 extremities w/o difficulty. Affect pleasant   ASSESSMENT & PLAN:  1. Systolic CHF due to post-partum cardiomyopathy - EF 20% by echo 10/2017 with moderate RV dysfunction. Bedside echo in clinic 12/2017 EF 55% with full LV recovery. Echo 02/2018: 55-60%  - Looks great NYHA I . - Continue spiro 12.5 mg daily.  - Can stop Entresto with orthostasis. Start losartan 25 - Continue Toprol 25 - Discussed risk of recurrence with future pregnancies. She is not planning future pregnancies  - Can wean meds down the road.   Arvilla Meres, MD 09/03/18

## 2018-09-03 NOTE — Addendum Note (Signed)
Encounter addended by: Noralee Space, RN on: 09/03/2018 11:20 AM  Actions taken: Order list changed, Sign clinical note

## 2018-09-13 ENCOUNTER — Telehealth (HOSPITAL_COMMUNITY): Payer: Self-pay | Admitting: *Deleted

## 2018-09-13 ENCOUNTER — Other Ambulatory Visit (HOSPITAL_COMMUNITY): Payer: Self-pay

## 2018-09-13 MED ORDER — SPIRONOLACTONE 25 MG PO TABS: 12.5000 mg | ORAL_TABLET | Freq: Every day | ORAL | 5 refills | Status: DC

## 2018-09-13 NOTE — Telephone Encounter (Signed)
Work restriction form faxed to [employer] as requested.

## 2019-02-13 IMAGING — CT CT ANGIO CHEST
1 of 2 series · 19 of 32 positions shown · IV contrast (OMNIPAQUE)
Comparison: None.

CLINICAL DATA: Shortness of breath and epigastric pain for the past
4 days. Patient status post partum on 09/24/2017.

EXAM:
CT ANGIOGRAPHY CHEST WITH CONTRAST
TECHNIQUE: Multidetector CT imaging of the chest was performed using the
standard protocol during bolus administration of intravenous
contrast. Multiplanar CT image reconstructions and MIPs were
obtained to evaluate the vascular anatomy.
CONTRAST:  100 cc Isovue 370.

[Series 5: (person_name) thins · axial · 0.60mm/px · z∈[+832,+1088]mm · 19 of 408 slices shown]
[im 21/408  lung]
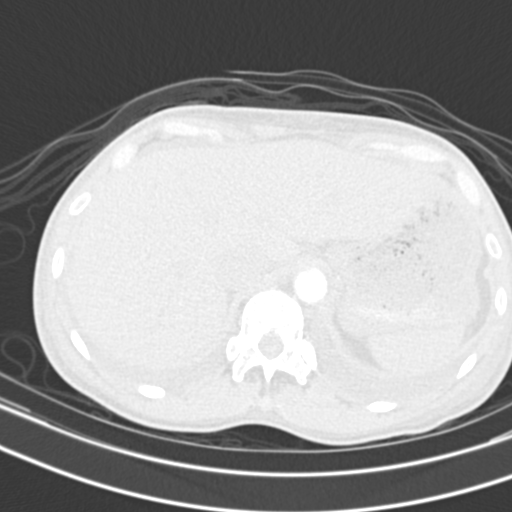
[im 41/408  mediastinal]
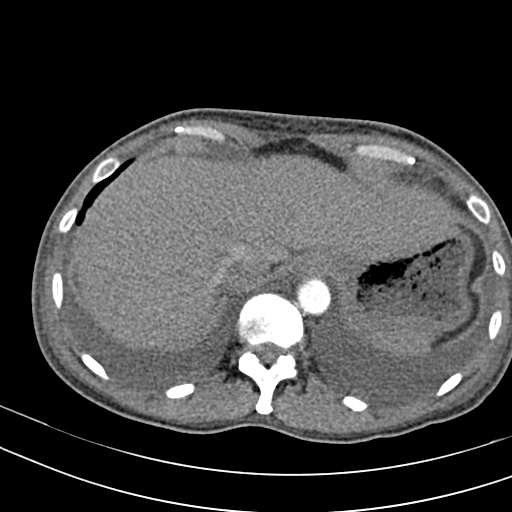
[im 62/408  lung]
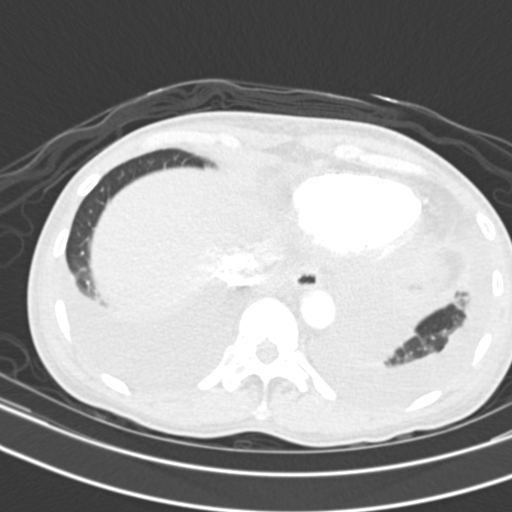
[im 102/408  mediastinal]
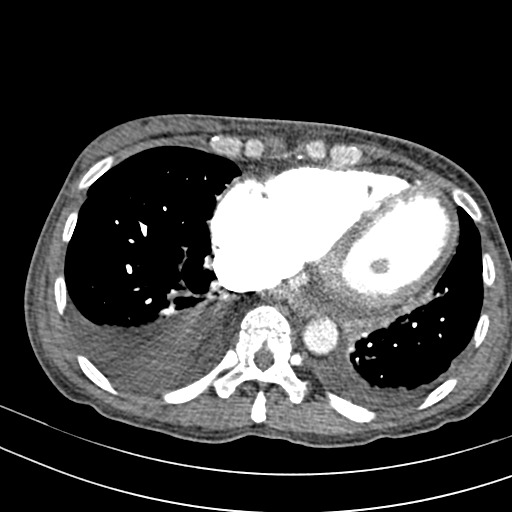
[im 123/408  lung]
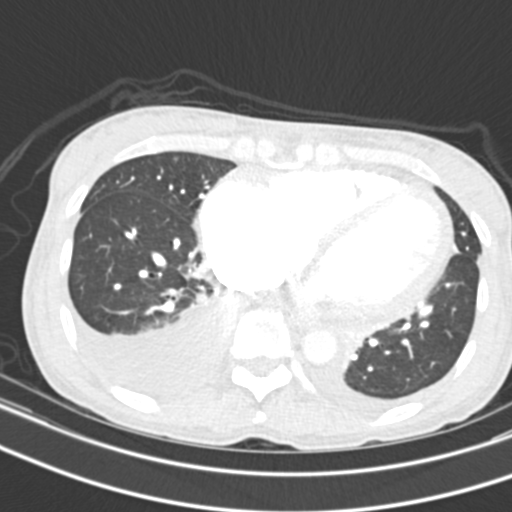
[im 136/408  mediastinal]
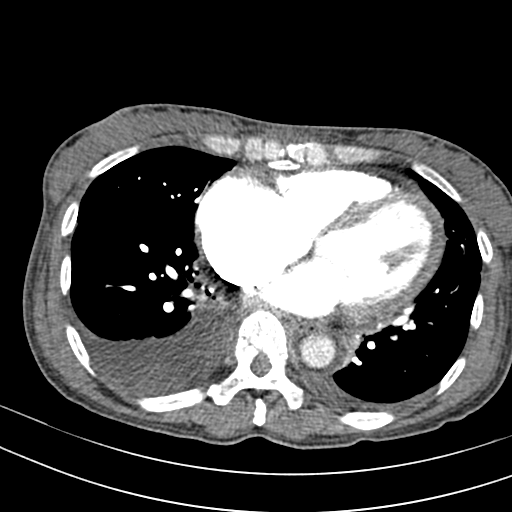
[im 143/408  lung]
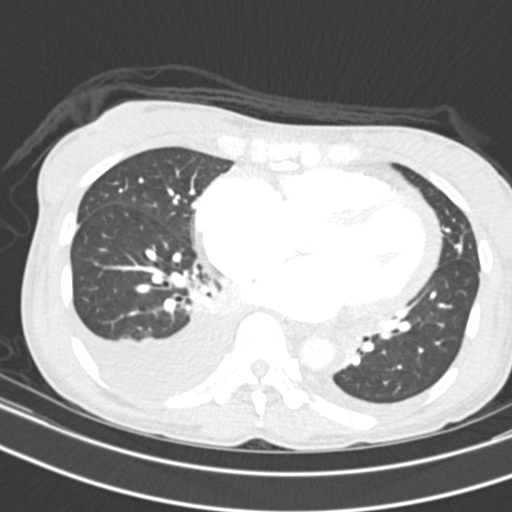
[im 163/408  mediastinal]
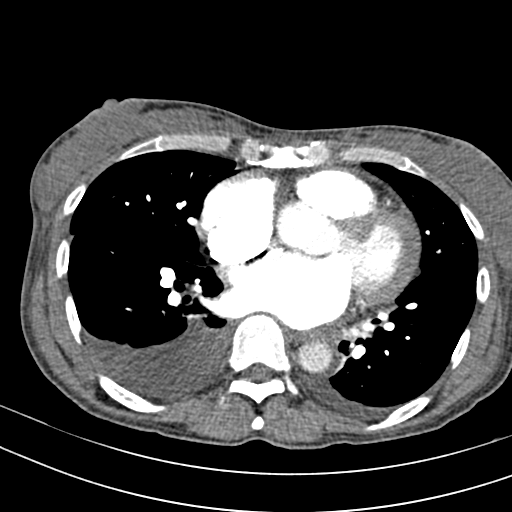
[im 184/408  lung]
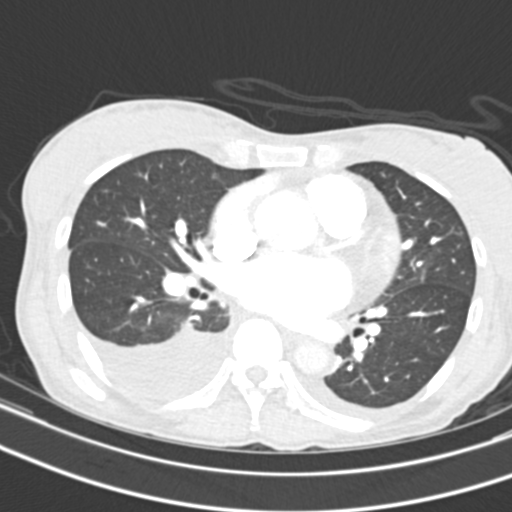
[im 204/408  mediastinal]
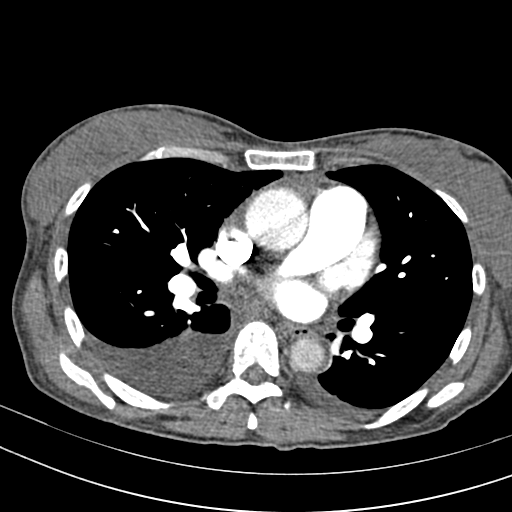
[im 224/408  lung]
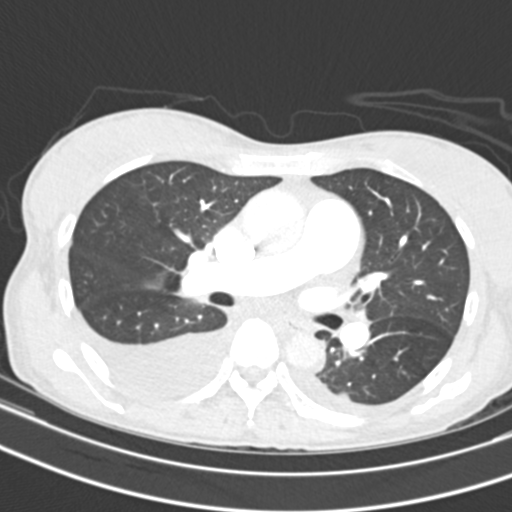
[im 245/408  mediastinal]
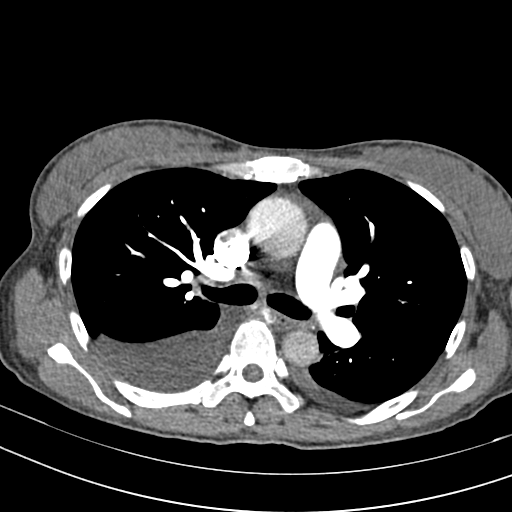
[im 265/408  lung]
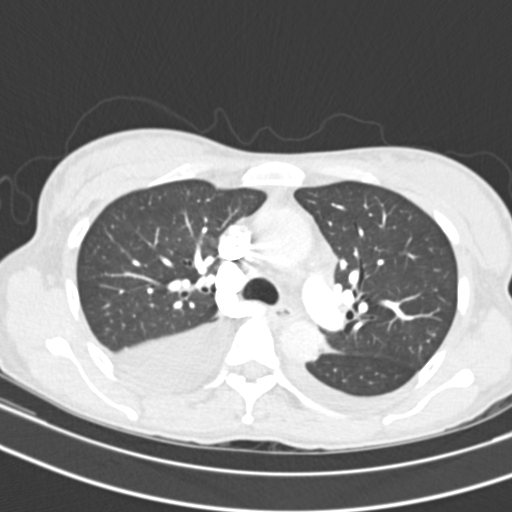
[im 272/408  mediastinal]
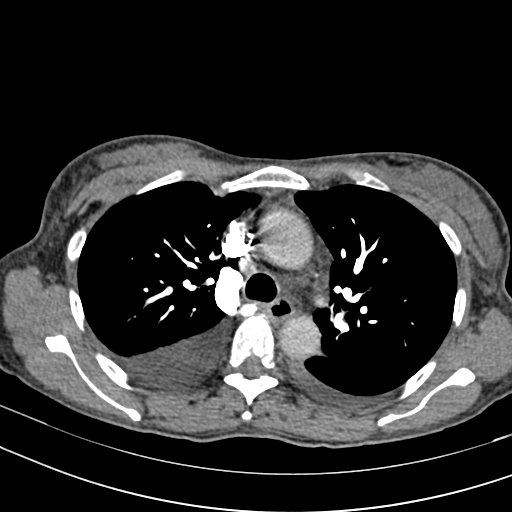
[im 285/408  lung]
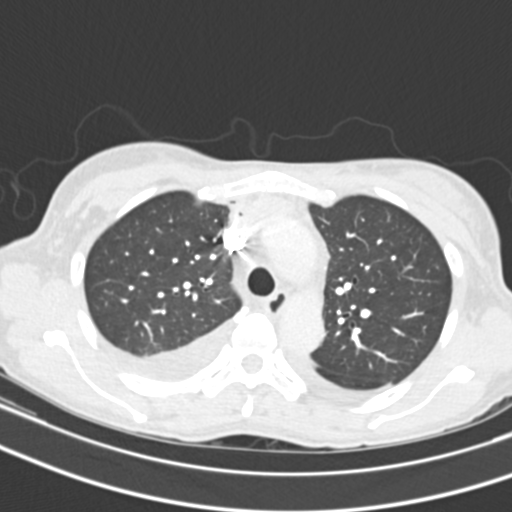
[im 306/408  mediastinal]
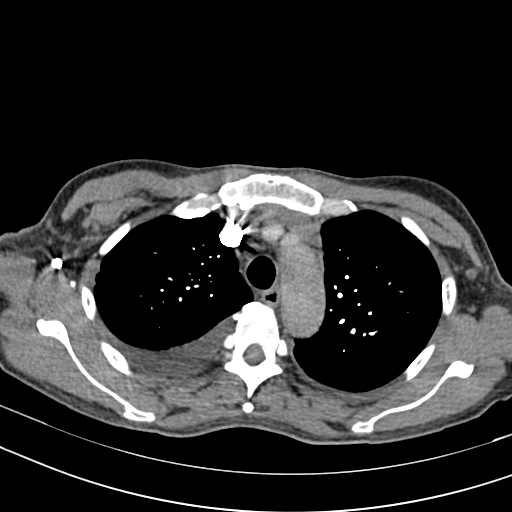
[im 346/408  lung]
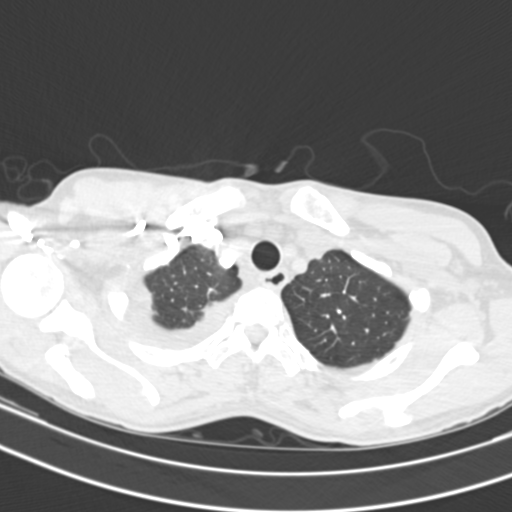
[im 367/408  mediastinal]
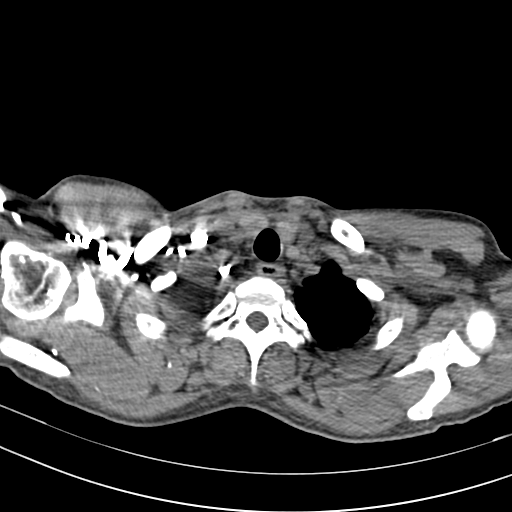
[im 387/408  lung]
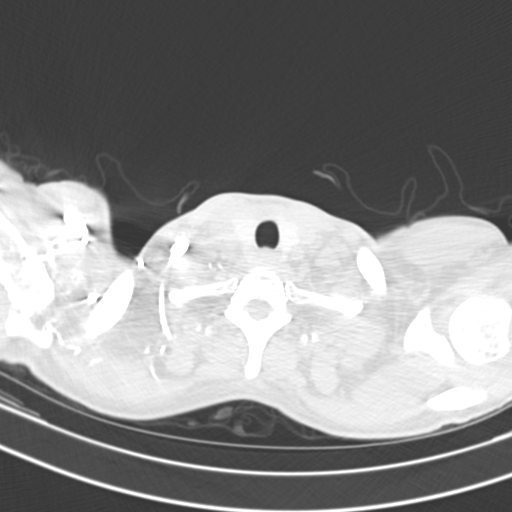

[19 of 32 positions shown; findings below may reference images not displayed]

FINDINGS: Cardiovascular: No pulmonary embolus is identified. The pulmonary
arteries are well opacified. There is cardiomegaly. No pericardial
effusion.

Mediastinum/Nodes: No enlarged mediastinal, hilar, or axillary lymph
nodes. Thyroid gland, trachea, and esophagus demonstrate no
significant findings.

Lungs/Pleura: Small to moderate bilateral pleural effusions are
present, larger on the right. The lungs demonstrate only mild
dependent atelectatic change.

Upper Abdomen: Negative.

Musculoskeletal: Negative.

Review of the MIP images confirms the above findings.
IMPRESSION: Negative for pulmonary embolus.

Small to moderate bilateral pleural effusions, larger on the right.

Cardiomegaly without evidence of pulmonary edema.

## 2019-03-21 ENCOUNTER — Other Ambulatory Visit (HOSPITAL_COMMUNITY): Payer: Self-pay | Admitting: Student

## 2019-03-21 ENCOUNTER — Other Ambulatory Visit (HOSPITAL_COMMUNITY): Payer: Self-pay | Admitting: Internal Medicine

## 2019-03-22 ENCOUNTER — Encounter (HOSPITAL_COMMUNITY): Payer: Self-pay | Admitting: *Deleted

## 2019-03-26 ENCOUNTER — Telehealth (HOSPITAL_COMMUNITY): Payer: Self-pay | Admitting: Cardiology

## 2019-03-26 ENCOUNTER — Ambulatory Visit (HOSPITAL_COMMUNITY)
Admission: RE | Admit: 2019-03-26 | Discharge: 2019-03-26 | Disposition: A | Discharge status: Home / Self Care | Payer: 59 | Source: Ambulatory Visit | Attending: Adult Health | Admitting: Adult Health

## 2019-03-26 ENCOUNTER — Other Ambulatory Visit: Payer: Self-pay

## 2019-03-26 NOTE — Telephone Encounter (Signed)
Called patient this afternoon x 2 for telephone visit, but got no answer. Left voicemail and asked her to call back to reschedule appointment.   Alford Highland, NP

## 2019-03-28 ENCOUNTER — Telehealth (HOSPITAL_COMMUNITY): Payer: 59

## 2019-03-29 ENCOUNTER — Ambulatory Visit (HOSPITAL_COMMUNITY)
Admission: RE | Admit: 2019-03-29 | Discharge: 2019-03-29 | Disposition: A | Discharge status: Home / Self Care | Payer: 59 | Source: Ambulatory Visit | Attending: Adult Health | Admitting: Adult Health

## 2019-03-29 ENCOUNTER — Other Ambulatory Visit: Payer: Self-pay

## 2019-03-29 DIAGNOSIS — I5022 Chronic systolic (congestive) heart failure: Secondary | ICD-10-CM | POA: Diagnosis not present

## 2019-03-29 DIAGNOSIS — O903 Peripartum cardiomyopathy: Secondary | ICD-10-CM

## 2019-03-29 NOTE — Addendum Note (Signed)
Encounter addended by: Marisa Hua, RN on: 03/29/2019 10:30 AM  Actions taken: Medication long-term status modified, Order list changed, Diagnosis association updated, Clinical Note Signed

## 2019-03-29 NOTE — Progress Notes (Signed)
Spoke with patient, discussed AVS. Pt scheduled for Tuesday for labwork.  Pt will f/u 1 year. Verbalized understanding.

## 2019-03-29 NOTE — Progress Notes (Signed)
Heart Failure TeleHealth Note  Due to national recommendations of social distancing due to COVID 19, Audio/video telehealth visit is felt to be most appropriate for this patient at this time.  See MyChart message from today for patient consent regarding telehealth for [facility].  Date:  03/29/2019   ID:  Evelyn Clark, DOB [birth year], MRN [id number]  Location: Home  Provider location: [place] [facility] Type of Visit: Established patient   PCP:  Patient, No Pcp Per  Cardiologist:  No primary care provider on file. Primary HF: Dr Gala RomneyBensimhon.   Chief Complaint: Heart Failure   History of Present Illness: Evelyn Clark is a 48 y.o. female with a history of  48 y.o. 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   Admitted 11/2 -10/10/17 with new systolic CHF. She was 2 weeks post-partum via vaginal delivery at the time of admission. She had uncomplicated pregnancy with no unusual cardiac symptoms. Admitted with orthopnea, LE edema, and DOE. Found to have LFT elevation so treated for pre-eclampsia though had no BP elevation. CT chest showed no PE, though did show B/L pleural effusions and cardiomegaly. Stat echo showed EF 20% and moderate RV dysfunction. GDMT initiated and HF team consulted. EF has since recovered.   She presents via Web designeraudio/video conferencing for a telehealth visit today.   Overall feeling fine. Denies SOB/PND/Orthopnea. Appetite ok. No fever or chills. Taking all medications. She says she misses her medications 1 day a week. Working full time at [employer].   she denies symptoms worrisome for COVID 19.    Echo 12/2017: EF 55%  Echo 02/2018: EF 55-60%  Past Medical History:  Diagnosis Date  . Arthritis   . Hemoglobin E trait (HCC)   . Newborn product of in vitro fertilization (IVF) pregnancy   . Vitamin D deficiency    Past Surgical History:  Procedure Laterality Date  . WISDOM TOOTH EXTRACTION       Current  Outpatient Medications  Medication Sig Dispense Refill  . acetaminophen (TYLENOL) 500 MG tablet Take 1-2 tablets (500-1,000 mg total) every 8 (eight) hours as needed by mouth for mild pain or moderate pain.    . Cholecalciferol (VITAMIN D3) 5000 units CAPS Take 5,000 Units by mouth daily.    Marland Kitchen. docusate sodium (COLACE) 100 MG capsule Take 300 mg by mouth daily.    . ferrous sulfate 325 (65 FE) MG EC tablet Take 1 tablet (325 mg total) by mouth daily with breakfast. 30 tablet 3  . furosemide (LASIX) 20 MG tablet Take 1 tablet (20 mg total) as needed by mouth (For wt gain of 3 lbs overnight or 5 lbs in a week). 30 tablet 6  . losartan (COZAAR) 25 MG tablet Take 1 tablet (25 mg total) by mouth daily. 30 tablet 6  . metoprolol succinate (TOPROL-XL) 25 MG 24 hr tablet TAKE ONE TABLET BY MOUTH ONE TIME DAILY  30 tablet 4  . pantoprazole (PROTONIX) 20 MG tablet Take 20 mg daily by mouth.    . potassium chloride SA (K-DUR) 20 MEQ tablet TAKE ONE TABLET BY MOUTH ONE TIME DAILY  30 tablet 4  . Prenatal Vit-Fe Fumarate-FA (PRENATAL MULTIVITAMIN) TABS tablet Take 1 tablet by mouth daily at 12 noon.    Marland Kitchen. spironolactone (ALDACTONE) 25 MG tablet Take 0.5 tablets (12.5 mg total) by mouth daily. 15 tablet 5   No current facility-administered medications for this encounter.     Allergies:   Patient has no  known allergies.   Social History:  The patient  reports that she has never smoked. She has never used smokeless tobacco. She reports that she does not drink alcohol or use drugs.   Family History:  The patient's family history includes Breast cancer in her maternal aunt; Hyperlipidemia in her mother; Hypertension in her father and mother; Osteoporosis in her mother.   ROS:  Please see the history of present illness.   All other systems are personally reviewed and negative.   Exam:  Tele Health Call; Exam is subjective and or/visual.) General:  Speaks in full sentences. No resp difficulty. Lungs: Normal  respiratory effort with conversation.  Abdomen: Non-distended per patient report Extremities: Pt denies edema. Neuro: Alert & oriented x 3.   Recent Labs: No results found for requested labs within last 8760 hours.  Personally reviewed   Wt Readings from Last 3 Encounters:  09/03/18 53.1 kg (117 lb)  02/16/18 53.9 kg (118 lb 12 oz)  12/11/17 54.2 kg (119 lb 8 oz)      ASSESSMENT AND PLAN:  1. Systolic CHF due to post-partum cardiomyopathy - EF 20% by echo 10/2017 with moderate RV dysfunction.  -  Echo 02/2018: 55-60%  -NYHA I. Volume status sounds stable. She can use lasix as needed.  Continue spiro 12.5 mg daily. Check BMET  - .Continue losartan 25 mg daily  - Continue Toprol 25 mg daily.    COVID screen The patient does not have any symptoms that suggest any further testing/ screening at this time.  Social distancing reinforced today.  Patient Risk: After full review of this patients clinical status, I feel that they are at moderate risk for cardiac decompensation at this time.  Relevant cardiac medications were reviewed at length with the patient today. The patient does not have concerns regarding their medications at this time.   The following changes were made today:  Check BMET next week. Stop potassium.   Recommended follow-up:  1 year with Dr Gala Romney and an ECHO.  Today, I have spent 15  minutes with the patient with telehealth technology discussing the above issues.    [name], NP  03/29/2019 9:57 AM  [facility] [address] [facility] [place] [address] [phone] (office) [phone] (fax)

## 2019-03-29 NOTE — Patient Instructions (Addendum)
Check BMET next week.   Stop potassium   Recommended follow-up:  1 year with Dr Gala Romney and an ECHO

## 2019-04-02 ENCOUNTER — Ambulatory Visit (HOSPITAL_COMMUNITY)
Admission: RE | Admit: 2019-04-02 | Discharge: 2019-04-02 | Disposition: A | Discharge status: Home / Self Care | Payer: 59 | Source: Ambulatory Visit | Attending: Internal Medicine | Admitting: Internal Medicine

## 2019-04-02 ENCOUNTER — Other Ambulatory Visit: Payer: Self-pay

## 2019-04-02 DIAGNOSIS — I5022 Chronic systolic (congestive) heart failure: Secondary | ICD-10-CM | POA: Diagnosis not present

## 2019-04-02 LAB — BASIC METABOLIC PANEL
Anion gap: 7 (ref 5–15)
BUN: 11 mg/dL (ref 6–20)
CO2: 28 mmol/L (ref 22–32)
Calcium: 9.6 mg/dL (ref 8.9–10.3)
Chloride: 104 mmol/L (ref 98–111)
Creatinine, Ser: 0.9 mg/dL (ref 0.44–1.00)
GFR calc Af Amer: >60 mL/min (ref >60)
GFR calc non Af Amer: >60 mL/min (ref >60)
Glucose, Bld: 108 mg/dL — ABNORMAL HIGH (ref 70–99)
Potassium: 4 mmol/L (ref 3.5–5.1)
Sodium: 139 mmol/L (ref 135–145)

## 2019-08-02 ENCOUNTER — Other Ambulatory Visit (HOSPITAL_COMMUNITY): Payer: Self-pay | Admitting: Internal Medicine

## 2019-09-07 ENCOUNTER — Other Ambulatory Visit (HOSPITAL_COMMUNITY): Payer: Self-pay | Admitting: Internal Medicine

## 2019-12-04 ENCOUNTER — Other Ambulatory Visit (HOSPITAL_COMMUNITY): Payer: Self-pay | Admitting: Internal Medicine

## 2020-03-18 ENCOUNTER — Other Ambulatory Visit (HOSPITAL_COMMUNITY): Payer: Self-pay | Admitting: Internal Medicine

## 2020-06-09 NOTE — Progress Notes (Signed)
[facility] Note  Date:  06/09/2020   ID:  Evelyn Clark, DOB [birth year], MRN [id number]  Location: Home  Provider location: [place] [facility] Type of Visit: Established patient   PCP:  Patient, No Pcp Per  Cardiologist:  No primary care provider on file. Primary HF: Dr Gala Romney.   Chief Complaint: Heart Failure   History of Present Illness: Evelyn Clark is a 49 y.o. female with a history of  49 y.o. 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   Admitted 11/2 -10/10/17 with new systolic CHF. She was 2 weeks post-partum via vaginal delivery at the time of admission. She had uncomplicated pregnancy with no unusual cardiac symptoms. Admitted with orthopnea, LE edema, and DOE. Found to have LFT elevation so treated for pre-eclampsia though had no BP elevation. CT chest showed no PE, though did show B/L pleural effusions and cardiomegaly. Stat echo showed EF 20% and moderate RV dysfunction. GDMT initiated and HF team consulted. EF has since recovered.    Echo 12/2017: EF 55%  Echo 02/2018: EF 55-60%   She is here for yearly follow-up. Doing very well. Relatively active with her daughter who is now almost 60 years old. No CP or SOB. No edema. Occasionally fatigued and lightheaded.SBP runs in 90s       Past Medical History:  Diagnosis Date  . Arthritis   . Hemoglobin E trait (HCC)   . Newborn product of in vitro fertilization (IVF) pregnancy   . Vitamin D deficiency    Past Surgical History:  Procedure Laterality Date  . WISDOM TOOTH EXTRACTION       Current Outpatient Medications  Medication Sig Dispense Refill  . acetaminophen (TYLENOL) 500 MG tablet Take 1-2 tablets (500-1,000 mg total) every 8 (eight) hours as needed by mouth for mild pain or moderate pain.    . Cholecalciferol (VITAMIN D3) 5000 units CAPS Take 5,000 Units by mouth daily.    Marland Kitchen docusate sodium (COLACE) 100 MG capsule Take 300 mg  by mouth daily.    . furosemide (LASIX) 20 MG tablet Take 1 tablet (20 mg total) as needed by mouth (For wt gain of 3 lbs overnight or 5 lbs in a week). 30 tablet 6  . losartan (COZAAR) 25 MG tablet TAKE ONE TABLET BY MOUTH ONE TIME DAILY  30 tablet 3  . metoprolol succinate (TOPROL-XL) 25 MG 24 hr tablet TAKE ONE TABLET BY MOUTH ONE TIME DAILY 30 tablet 3  . pantoprazole (PROTONIX) 20 MG tablet Take 20 mg daily by mouth.    . Prenatal Vit-Fe Fumarate-FA (PRENATAL MULTIVITAMIN) TABS tablet Take 1 tablet by mouth daily at 12 noon.    Marland Kitchen spironolactone (ALDACTONE) 25 MG tablet TAKE HALF TABLET BY MOUTH DAILY  45 tablet 3   No current facility-administered medications for this encounter.    Allergies:   Patient has no known allergies.   Social History:  The patient  reports that she has never smoked. She has never used smokeless tobacco. She reports that she does not drink alcohol and does not use drugs.   Family History:  The patient's family history includes Breast cancer in her maternal aunt; Hyperlipidemia in her mother; Hypertension in her father and mother; Osteoporosis in her mother.   ROS:  Please see the history of present illness.   All other systems are personally reviewed and negative.   Vitals:   06/10/20 0909  BP: 98/68  Pulse: 68  SpO2: 98%  Weight: 54 kg (119 lb)    Exam:    General:  Well appearing. No resp difficulty HEENT: normal Neck: supple. no JVD. Carotids 2+ bilat; no bruits. No lymphadenopathy or thryomegaly appreciated. Cor: PMI nondisplaced. Regular rate & rhythm. No rubs, gallops or murmurs. Lungs: clear Abdomen: soft, nontender, nondistended. No hepatosplenomegaly. No bruits or masses. Good bowel sounds. Extremities: no cyanosis, clubbing, rash, edema Neuro: alert & orientedx3, cranial nerves grossly intact. moves all 4 extremities w/o difficulty. Affect pleasant  ECG: NSR 64 No ST-T wave abnormalities. Personally reviewed   Recent Labs: No results  found for requested labs within last 8760 hours.  Personally reviewed   Wt Readings from Last 3 Encounters:  06/10/20 54 kg (119 lb)  09/03/18 53.1 kg (117 lb)  02/16/18 53.9 kg (118 lb 12 oz)      ASSESSMENT AND PLAN:  1. Systolic CHF due to post-partum cardiomyopathy - EF 20% by echo 10/2017 with moderate RV dysfunction.  - EF recovered with medically therapy. Echo 02/2018: 55-60%  - Doing very well. NYHA I - BP low. Will stop spiro 12.5 mg daily.  - Continue losartan 25 mg daily  - Continue Toprol 25 mg daily. - Doesn't have PCP. Will check labs and repeat echo  Signed, Arvilla Meres, MD  06/09/2020 4:53 PM  [facility] [address] [facility] [place] [address] [phone] (office) [phone] (fax)

## 2020-06-10 ENCOUNTER — Ambulatory Visit (HOSPITAL_COMMUNITY)
Admission: RE | Admit: 2020-06-10 | Discharge: 2020-06-10 | Disposition: A | Discharge status: Home / Self Care | Payer: 59 | Source: Ambulatory Visit | Attending: Internal Medicine | Admitting: Internal Medicine

## 2020-06-10 ENCOUNTER — Encounter (HOSPITAL_COMMUNITY): Payer: Self-pay | Admitting: Internal Medicine

## 2020-06-10 ENCOUNTER — Other Ambulatory Visit: Payer: Self-pay

## 2020-06-10 VITALS — BP 98/68 | HR 68 | Wt 119.0 lb

## 2020-06-10 DIAGNOSIS — O903 Peripartum cardiomyopathy: Secondary | ICD-10-CM | POA: Diagnosis not present

## 2020-06-10 DIAGNOSIS — I5020 Unspecified systolic (congestive) heart failure: Secondary | ICD-10-CM | POA: Diagnosis present

## 2020-06-10 DIAGNOSIS — Z8249 Family history of ischemic heart disease and other diseases of the circulatory system: Secondary | ICD-10-CM | POA: Insufficient documentation

## 2020-06-10 DIAGNOSIS — J90 Pleural effusion, not elsewhere classified: Secondary | ICD-10-CM | POA: Diagnosis not present

## 2020-06-10 DIAGNOSIS — Z79899 Other long term (current) drug therapy: Secondary | ICD-10-CM | POA: Diagnosis not present

## 2020-06-10 DIAGNOSIS — M1990 Unspecified osteoarthritis, unspecified site: Secondary | ICD-10-CM | POA: Insufficient documentation

## 2020-06-10 LAB — CBC
HCT: 40.6 % (ref 36.0–46.0)
Hemoglobin: 13.1 g/dL (ref 12.0–15.0)
MCH: 25.4 pg — ABNORMAL LOW (ref 26.0–34.0)
MCHC: 32.3 g/dL (ref 30.0–36.0)
MCV: 78.8 fL — ABNORMAL LOW (ref 80.0–100.0)
Platelets: 213 10*3/uL (ref 150–400)
RBC: 5.15 MIL/uL — ABNORMAL HIGH (ref 3.87–5.11)
RDW: 13.1 % (ref 11.5–15.5)
WBC: 4.6 10*3/uL (ref 4.0–10.5)
nRBC: 0 % (ref 0.0–0.2)

## 2020-06-10 LAB — COMPREHENSIVE METABOLIC PANEL
ALT: 16 U/L (ref 0–44)
AST: 25 U/L (ref 15–41)
Albumin: 4 g/dL (ref 3.5–5.0)
Alkaline Phosphatase: 47 U/L (ref 38–126)
Anion gap: 10 (ref 5–15)
BUN: 10 mg/dL (ref 6–20)
CO2: 25 mmol/L (ref 22–32)
Calcium: 9.3 mg/dL (ref 8.9–10.3)
Chloride: 103 mmol/L (ref 98–111)
Creatinine, Ser: 0.82 mg/dL (ref 0.44–1.00)
GFR calc Af Amer: >60 mL/min (ref >60)
GFR calc non Af Amer: >60 mL/min (ref >60)
Glucose, Bld: 83 mg/dL (ref 70–99)
Potassium: 3.6 mmol/L (ref 3.5–5.1)
Sodium: 138 mmol/L (ref 135–145)
Total Bilirubin: 0.3 mg/dL (ref 0.3–1.2)
Total Protein: 7.1 g/dL (ref 6.5–8.1)

## 2020-06-10 LAB — LIPID PANEL
Cholesterol: 199 mg/dL (ref 0–200)
HDL: 81 mg/dL (ref >40)
LDL Cholesterol: 110 mg/dL — ABNORMAL HIGH (ref 0–99)
Total CHOL/HDL Ratio: 2.5 RATIO
Triglycerides: 39 mg/dL (ref <150)
VLDL: 8 mg/dL (ref 0–40)

## 2020-06-10 LAB — HEMOGLOBIN A1C
Hgb A1c MFr Bld: 5.6 % (ref 4.8–5.6)
Mean Plasma Glucose: 114.02 mg/dL

## 2020-06-10 NOTE — Patient Instructions (Signed)
STOP Spironolactone  Labs today We will only contact you if something comes back abnormal or we need to make some changes. Otherwise no news is good news!  Your physician recommends that you schedule a follow-up appointment in: 2-4 months with Dr Gala Romney and echo  Your physician has requested that you have an echocardiogram. Echocardiography is a painless test that uses sound waves to create images of your heart. It provides your doctor with information about the size and shape of your heart and how well your heart's chambers and valves are working. This procedure takes approximately one hour. There are no restrictions for this procedure.   At the [facility], you and your health needs are our priority. As part of our continuing mission to provide you with exceptional heart care, we have created designated Provider Care Teams. These Care Teams include your primary Cardiologist (physician) and Advanced Practice Providers (APPs- Physician Assistants and Nurse Practitioners) who all work together to provide you with the care you need, when you need it.   You may see any of the following providers on your designated Care Team at your next follow up: Marland Kitchen Dr Arvilla Meres . Dr Marca Ancona . Tonye Becket, NP . Robbie Lis, PA . Karle Plumber, PharmD   Please be sure to bring in all your medications bottles to every appointment.

## 2020-06-10 NOTE — Addendum Note (Signed)
Encounter addended by: Theresia Bough, CMA on: 06/10/2020 9:45 AM  Actions taken: Diagnosis association updated, Charge Capture section accepted, Order list changed, Medication long-term status modified, Clinical Note Signed

## 2020-07-29 ENCOUNTER — Telehealth (HOSPITAL_COMMUNITY): Payer: Self-pay | Admitting: *Deleted

## 2020-07-29 NOTE — Telephone Encounter (Signed)
Patient called stating her she received a bill and her lab work cost her over $500. Pt said her insurance will cover 100% if her labs are processed through [facility]. I explained to her that all of the lab work we draw in clinic is processed in house. She said her insurance told her they told the hospital to only process her through [facility]. I told her I did not think there is a way for [place] to have have all of her labs processed through [facility]. Patient asked that we flag her chart stating she can only use [facility]. I told her I would follow up with Meredith Staggers, RN but I do not think there is a way for our whole team to know to or change her lab agency. Pt said she will remind our staff during her appointments. Per Herbert Seta patient will have to take an order and have labs drawn at [facility] as we can not have our lab send out labs to [facility] because we process majority of labs in house.

## 2020-08-27 ENCOUNTER — Ambulatory Visit (HOSPITAL_COMMUNITY)
Admission: RE | Admit: 2020-08-27 | Discharge: 2020-08-27 | Disposition: A | Discharge status: Home / Self Care | Payer: 59 | Source: Ambulatory Visit | Attending: Internal Medicine | Admitting: Internal Medicine

## 2020-08-27 ENCOUNTER — Encounter (HOSPITAL_COMMUNITY): Payer: Self-pay | Admitting: Internal Medicine

## 2020-08-27 ENCOUNTER — Other Ambulatory Visit: Payer: Self-pay

## 2020-08-27 ENCOUNTER — Ambulatory Visit (HOSPITAL_BASED_OUTPATIENT_CLINIC_OR_DEPARTMENT_OTHER)
Admission: RE | Admit: 2020-08-27 | Discharge: 2020-08-27 | Disposition: A | Discharge status: Home / Self Care | Payer: 59 | Source: Ambulatory Visit | Attending: Internal Medicine | Admitting: Internal Medicine

## 2020-08-27 VITALS — BP 110/60 | HR 61 | Ht 64.5 in | Wt 117.6 lb

## 2020-08-27 DIAGNOSIS — O903 Peripartum cardiomyopathy: Secondary | ICD-10-CM

## 2020-08-27 DIAGNOSIS — E162 Hypoglycemia, unspecified: Secondary | ICD-10-CM | POA: Insufficient documentation

## 2020-08-27 DIAGNOSIS — Z79899 Other long term (current) drug therapy: Secondary | ICD-10-CM | POA: Diagnosis not present

## 2020-08-27 DIAGNOSIS — I5022 Chronic systolic (congestive) heart failure: Secondary | ICD-10-CM | POA: Diagnosis not present

## 2020-08-27 DIAGNOSIS — I5020 Unspecified systolic (congestive) heart failure: Secondary | ICD-10-CM | POA: Diagnosis not present

## 2020-08-27 DIAGNOSIS — M1990 Unspecified osteoarthritis, unspecified site: Secondary | ICD-10-CM | POA: Diagnosis not present

## 2020-08-27 LAB — ECHOCARDIOGRAM COMPLETE
AR max vel: 2.34 cm2
AV Area VTI: 2.11 cm2
AV Area mean vel: 2.19 cm2
AV Mean grad: 3 mmHg
AV Peak grad: 5.5 mmHg
Ao pk vel: 1.17 m/s
Area-P 1/2: 2.29 cm2
S' Lateral: 2.4 cm

## 2020-08-27 NOTE — Addendum Note (Signed)
Encounter addended by: Noralee Space, RN on: 08/27/2020 2:27 PM  Actions taken: Clinical Note Signed

## 2020-08-27 NOTE — Progress Notes (Signed)
  Echocardiogram 2D Echocardiogram has been performed.  Evelyn Clark 08/27/2020, 1:47 PM

## 2020-08-27 NOTE — Patient Instructions (Signed)
Please call our office in 1 year to schedule your follow up appointment  If you have any questions or concerns before your next appointment please send us a message through mychart or call our office at [phone].    TO LEAVE A MESSAGE FOR THE NURSE SELECT OPTION 2, PLEASE LEAVE A MESSAGE INCLUDING: . YOUR NAME . DATE OF BIRTH . CALL BACK NUMBER . REASON FOR CALL**this is important as we prioritize the call backs  YOU WILL RECEIVE A CALL BACK THE SAME DAY AS LONG AS YOU CALL BEFORE 4:00 PM  At the [facility], you and your health needs are our priority. As part of our continuing mission to provide you with exceptional heart care, we have created designated Provider Care Teams. These Care Teams include your primary Cardiologist (physician) and Advanced Practice Providers (APPs- Physician Assistants and Nurse Practitioners) who all work together to provide you with the care you need, when you need it.   You may see any of the following providers on your designated Care Team at your next follow up: . Dr Daniel Bensimhon . Dr Dalton McLean . Amy Clegg, NP . Brittainy Simmons, PA . Lauren Kemp, PharmD   Please be sure to bring in all your medications bottles to every appointment.    

## 2020-08-27 NOTE — Progress Notes (Signed)
[facility] Clinic Note  Date:  08/27/2020   ID:  Evelyn Clark, DOB [birth year], MRN [id number]  Location: Home  Provider location: [place] [facility] Type of Visit: Established patient   PCP:  Default, Provider, MD  Cardiologist:  No primary care provider on file. Primary HF: Dr Gala Romney.   Chief Complaint: Heart Failure   History of Present Illness: Evelyn Clark is a 49 y.o. female with a history of  49 y.o. 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   Admitted 11/2 -10/10/17 with new systolic CHF. She was 2 weeks post-partum via vaginal delivery at the time of admission. She had uncomplicated pregnancy with no unusual cardiac symptoms. Admitted with orthopnea, LE edema, and DOE. Found to have LFT elevation so treated for pre-eclampsia though had no BP elevation. CT chest showed no PE, though did show B/L pleural effusions and cardiomegaly. Stat echo showed EF 20% and moderate RV dysfunction. GDMT initiated and HF team consulted. EF has since recovered.    Echo 12/2017: EF 55%  Echo 02/2018: EF 55-60%   She is here for follow-up. Doing very well. Active with her 49 y/o. No SOB, edema, orthopnea or PND. Says she is getting frequent episodes of symptomatic hypoglycemia.    Echo today 08/27/20: EF 60-65% RV normal Personally reviewed   Past Medical History:  Diagnosis Date   Arthritis    Hemoglobin E trait (HCC)    Newborn product of in vitro fertilization (IVF) pregnancy    Vitamin D deficiency    Past Surgical History:  Procedure Laterality Date   WISDOM TOOTH EXTRACTION       Current Outpatient Medications  Medication Sig Dispense Refill   Coenzyme Q10 (CO Q-10 PO) Take by mouth.     losartan (COZAAR) 25 MG tablet TAKE ONE TABLET BY MOUTH ONE TIME DAILY  30 tablet 3   metoprolol succinate (TOPROL-XL) 25 MG 24 hr tablet TAKE ONE TABLET BY MOUTH ONE TIME DAILY 30 tablet 3   Cholecalciferol  (VITAMIN D3) 5000 units CAPS Take 5,000 Units by mouth daily. (Patient not taking: Reported on 06/10/2020)     No current facility-administered medications for this encounter.    Allergies:   Patient has no known allergies.   Social History:  The patient  reports that she has never smoked. She has never used smokeless tobacco. She reports that she does not drink alcohol and does not use drugs.   Family History:  The patient's family history includes Breast cancer in her maternal aunt; Hyperlipidemia in her mother; Hypertension in her father and mother; Osteoporosis in her mother.   ROS:  Please see the history of present illness.   All other systems are personally reviewed and negative.   Vitals:   08/27/20 1356  BP: 110/60  Pulse: 61  SpO2: 100%  Weight: 53.3 kg (117 lb 9.6 oz)  Height: 5' 4.5" (1.638 m)    Exam:    General:  Well appearing. No resp difficulty HEENT: normal Neck: supple. no JVD. Carotids 2+ bilat; no bruits. No lymphadenopathy or thryomegaly appreciated. Cor: PMI nondisplaced. Regular rate & rhythm. No rubs, gallops or murmurs. Lungs: clear Abdomen: soft, nontender, nondistended. No hepatosplenomegaly. No bruits or masses. Good bowel sounds. Extremities: no cyanosis, clubbing, rash, edema Neuro: alert & orientedx3, cranial nerves grossly intact. moves all 4 extremities w/o difficulty. Affect pleasant  Recent Labs: 06/10/2020: ALT 16; BUN 10; Creatinine, Ser 0.82; Hemoglobin 13.1; Platelets 213; Potassium  3.6; Sodium 138  Personally reviewed   Wt Readings from Last 3 Encounters:  08/27/20 53.3 kg (117 lb 9.6 oz)  06/10/20 54 kg (119 lb)  09/03/18 53.1 kg (117 lb)      ASSESSMENT AND PLAN:  1. Systolic CHF due to post-partum cardiomyopathy - EF 20% by echo 10/2017 with moderate RV dysfunction.  - EF recovered with medically therapy. Echo 02/2018: 55-60%  - Echo today 08/27/20 EF 60-65% RV normal - Doing very well. NYHA I  - Continue losartan 25 mg daily    - Continue Toprol 25 mg daily. - Cleda Daub stopped due to low BP - Continue yearly f/u  2. Symptomatic hypoglycemia - stressed need for frequent small meals - avoid simple sugars - Encouraged her to get PCP.    Signed, Arvilla Meres, MD  08/27/2020 2:15 PM  [facility] [address] [facility] [place] [address] [phone] (office) [phone] (fax)

## 2020-11-23 ENCOUNTER — Other Ambulatory Visit (HOSPITAL_COMMUNITY): Payer: Self-pay | Admitting: Internal Medicine

## 2021-06-15 ENCOUNTER — Other Ambulatory Visit (HOSPITAL_COMMUNITY): Payer: Self-pay | Admitting: *Deleted

## 2021-06-15 DIAGNOSIS — I5022 Chronic systolic (congestive) heart failure: Secondary | ICD-10-CM

## 2021-09-09 ENCOUNTER — Ambulatory Visit (HOSPITAL_BASED_OUTPATIENT_CLINIC_OR_DEPARTMENT_OTHER)
Admission: RE | Admit: 2021-09-09 | Discharge: 2021-09-09 | Disposition: A | Discharge status: Home / Self Care | Payer: 59 | Source: Ambulatory Visit | Attending: Internal Medicine | Admitting: Internal Medicine

## 2021-09-09 ENCOUNTER — Ambulatory Visit (HOSPITAL_COMMUNITY)
Admission: RE | Admit: 2021-09-09 | Discharge: 2021-09-09 | Disposition: A | Discharge status: Home / Self Care | Payer: 59 | Source: Ambulatory Visit | Attending: Internal Medicine | Admitting: Internal Medicine

## 2021-09-09 ENCOUNTER — Encounter (HOSPITAL_COMMUNITY): Payer: Self-pay | Admitting: Internal Medicine

## 2021-09-09 ENCOUNTER — Other Ambulatory Visit: Payer: Self-pay

## 2021-09-09 VITALS — BP 112/78 | HR 54 | Wt 115.4 lb

## 2021-09-09 DIAGNOSIS — O903 Peripartum cardiomyopathy: Secondary | ICD-10-CM

## 2021-09-09 DIAGNOSIS — I5022 Chronic systolic (congestive) heart failure: Secondary | ICD-10-CM | POA: Diagnosis present

## 2021-09-09 LAB — ECHOCARDIOGRAM COMPLETE
Area-P 1/2: 2.39 cm2
S' Lateral: 2.8 cm

## 2021-09-09 NOTE — Progress Notes (Signed)
Advanced [facility] Failure Clinic Note  Date:  09/09/2021   ID:  Evelyn Clark, DOB 09/27/1971, MRN [id number]  Location: Home  Provider location: [place] [facility] Type of Visit: Established patient   PCP:  Default, Provider, MD  Cardiologist:  None Primary HF: Dr Gala Romney.   Chief Complaint: Heart Failure   History of Present Illness: Evelyn Clark is a 50 y.o. female with a history of  50 y.o. 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   Admitted 11/2 -10/10/17 with new systolic CHF. She was 2 weeks post-partum via vaginal delivery at the time of admission. She had uncomplicated pregnancy with no unusual cardiac symptoms. Admitted with orthopnea, LE edema, and DOE. Found to have LFT elevation so treated for pre-eclampsia though had no BP elevation. CT chest showed no PE, though did show B/L pleural effusions and cardiomegaly. Stat echo showed EF 20% and moderate RV dysfunction. GDMT initiated and HF team consulted. EF has since recovered.    Echo 12/2017: EF 55%  Echo 02/2018: EF 55-60%  Echo 08/27/20: EF 60-65%   She is here for follow-up. Doing really well. Very active. No SOB, orthopnea or PND.  Echo today 09/09/21 EF 60-65% Personally reviewed      Past Medical History:  Diagnosis Date   Arthritis    Hemoglobin E trait (HCC)    Newborn product of in vitro fertilization (IVF) pregnancy    Vitamin D deficiency    Past Surgical History:  Procedure Laterality Date   WISDOM TOOTH EXTRACTION       Current Outpatient Medications  Medication Sig Dispense Refill   Cholecalciferol (VITAMIN D3) 5000 units CAPS Take 5,000 Units by mouth daily.     Coenzyme Q10 (CO Q-10 PO) Take by mouth.     losartan (COZAAR) 25 MG tablet TAKE ONE TABLET BY MOUTH ONE TIME DAILY  30 tablet 3   metoprolol succinate (TOPROL-XL) 25 MG 24 hr tablet TAKE ONE TABLET BY MOUTH ONE TIME DAILY 30 tablet 0   Prenatal Multivit-Min-Fe-FA  (PRE-NATAL PO) Take 1 tablet by mouth daily.     No current facility-administered medications for this encounter.    Allergies:   Patient has no known allergies.   Social History:  The patient  reports that she has never smoked. She has never used smokeless tobacco. She reports that she does not drink alcohol and does not use drugs.   Family History:  The patient's family history includes Breast cancer in her maternal aunt; Hyperlipidemia in her mother; Hypertension in her father and mother; Osteoporosis in her mother.   ROS:  Please see the history of present illness.   All other systems are personally reviewed and negative.   Vitals:   09/09/21 1523  BP: 112/78  Pulse: (!) 54  SpO2: 100%  Weight: 52.3 kg (115 lb 6.4 oz)    Exam:    General:  Well appearing. No resp difficulty HEENT: normal Neck: supple. no JVD. Carotids 2+ bilat; no bruits. No lymphadenopathy or thryomegaly appreciated. Cor: PMI nondisplaced. Regular rate & rhythm. No rubs, gallops or murmurs. Lungs: clear Abdomen: soft, nontender, nondistended. No hepatosplenomegaly. No bruits or masses. Good bowel sounds. Extremities: no cyanosis, clubbing, rash, edema Neuro: alert & orientedx3, cranial nerves grossly intact. moves all 4 extremities w/o difficulty. Affect pleasant  Ectopic atrial rhythm 56 No ST-T wave abnormalities. Personally reviewed   Recent Labs: No results found for requested labs within last 8760 hours.  Personally  reviewed   Wt Readings from Last 3 Encounters:  09/09/21 52.3 kg (115 lb 6.4 oz)  08/27/20 53.3 kg (117 lb 9.6 oz)  06/10/20 54 kg (119 lb)      ASSESSMENT AND PLAN:  1. Systolic CHF due to post-partum cardiomyopathy - EF 20% by echo 10/2017 with moderate RV dysfunction.  - EF recovered with medically therapy. Echo 02/2018: 55-60%  - Echo today 09/09/20 EF 60-65%RV normal - Doing very well. NYHA  - Continue losartan 25 mg daily  - Continue Toprol 25 mg daily. - Cleda Daub stopped  due to low BP - Continue yearly f/u  2. Ectopic atrial rhythm  - likely normal variant.    Signed, Arvilla Meres, MD  09/09/2021 4:02 PM  [facility] [address] [facility] and [facility] [place] [address] [phone] (office) [phone] (fax)

## 2022-08-11 ENCOUNTER — Telehealth (HOSPITAL_COMMUNITY): Payer: Self-pay

## 2022-08-11 NOTE — Telephone Encounter (Signed)
Received Surgical/Clearance form from [facility] form signed by Dr. Gala Romney and faxed successfully to [phone] on 08/11/22. Form will be scanned into patients chart.

## 2022-08-23 ENCOUNTER — Encounter (HOSPITAL_COMMUNITY): Payer: Self-pay | Admitting: Gastroenterology

## 2022-08-23 ENCOUNTER — Encounter (HOSPITAL_COMMUNITY)
Admission: RE | Disposition: A | Discharge status: Home / Self Care | Payer: Self-pay | Source: Home / Self Care | Attending: Gastroenterology

## 2022-08-23 ENCOUNTER — Ambulatory Visit (HOSPITAL_COMMUNITY)
Admission: RE | Admit: 2022-08-23 | Discharge: 2022-08-23 | Disposition: A | Discharge status: Home / Self Care | Payer: 59 | Attending: Gastroenterology | Admitting: Gastroenterology

## 2022-08-23 DIAGNOSIS — D649 Anemia, unspecified: Secondary | ICD-10-CM | POA: Insufficient documentation

## 2022-08-23 HISTORY — PX: GIVENS CAPSULE STUDY: SHX5432

## 2022-08-23 SURGERY — IMAGING PROCEDURE, GI TRACT, INTRALUMINAL, VIA CAPSULE

## 2022-08-23 SURGICAL SUPPLY — 1 items: TOWEL COTTON PACK 4EA (MISCELLANEOUS) ×2 IMPLANT

## 2022-08-25 ENCOUNTER — Telehealth: Payer: Self-pay | Admitting: Hematology and Oncology

## 2022-08-25 NOTE — Telephone Encounter (Signed)
Scheduled appt per 9/21 referral. Pt is aware of appt date and time. Pt is aware to arrive 15 mins prior to appt time and to bring and updated insurance card. Pt is aware of appt location.   

## 2022-09-14 ENCOUNTER — Inpatient Hospital Stay: Payer: 59 | Attending: Hematology and Oncology | Admitting: Hematology and Oncology

## 2022-09-14 ENCOUNTER — Inpatient Hospital Stay: Payer: 59

## 2022-09-14 ENCOUNTER — Other Ambulatory Visit: Payer: Self-pay

## 2022-09-14 DIAGNOSIS — E559 Vitamin D deficiency, unspecified: Secondary | ICD-10-CM | POA: Diagnosis not present

## 2022-09-14 DIAGNOSIS — D565 Hemoglobin E-beta thalassemia: Secondary | ICD-10-CM | POA: Insufficient documentation

## 2022-09-14 DIAGNOSIS — Z79899 Other long term (current) drug therapy: Secondary | ICD-10-CM | POA: Insufficient documentation

## 2022-09-14 DIAGNOSIS — R718 Other abnormality of red blood cells: Secondary | ICD-10-CM | POA: Diagnosis not present

## 2022-09-14 DIAGNOSIS — D500 Iron deficiency anemia secondary to blood loss (chronic): Secondary | ICD-10-CM

## 2022-09-14 DIAGNOSIS — Z803 Family history of malignant neoplasm of breast: Secondary | ICD-10-CM | POA: Insufficient documentation

## 2022-09-14 DIAGNOSIS — M129 Arthropathy, unspecified: Secondary | ICD-10-CM | POA: Insufficient documentation

## 2022-09-14 LAB — CBC WITH DIFFERENTIAL (CANCER CENTER ONLY)
Abs Immature Granulocytes: 0.01 10*3/uL (ref 0.00–0.07)
Basophils Absolute: 0 10*3/uL (ref 0.0–0.1)
Basophils Relative: 0 %
Eosinophils Absolute: 0 10*3/uL (ref 0.0–0.5)
Eosinophils Relative: 1 %
HCT: 39.5 % (ref 36.0–46.0)
Hemoglobin: 13.2 g/dL (ref 12.0–15.0)
Immature Granulocytes: 0 %
Lymphocytes Relative: 41 %
Lymphs Abs: 2.6 10*3/uL (ref 0.7–4.0)
MCH: 26.1 pg (ref 26.0–34.0)
MCHC: 33.4 g/dL (ref 30.0–36.0)
MCV: 78.2 fL — ABNORMAL LOW (ref 80.0–100.0)
Monocytes Absolute: 0.4 10*3/uL (ref 0.1–1.0)
Monocytes Relative: 6 %
Neutro Abs: 3.3 10*3/uL (ref 1.7–7.7)
Neutrophils Relative %: 52 %
Platelet Count: 222 10*3/uL (ref 150–400)
RBC: 5.05 MIL/uL (ref 3.87–5.11)
RDW: 12.7 % (ref 11.5–15.5)
WBC Count: 6.3 10*3/uL (ref 4.0–10.5)
nRBC: 0 % (ref 0.0–0.2)

## 2022-09-14 LAB — IRON AND IRON BINDING CAPACITY (CC-WL,HP ONLY)
Iron: 151 ug/dL (ref 28–170)
Saturation Ratios: 46 % — ABNORMAL HIGH (ref 10.4–31.8)
TIBC: 330 ug/dL (ref 250–450)
UIBC: 179 ug/dL

## 2022-09-14 LAB — FOLATE: Folate: 16.2 ng/mL (ref >5.9)

## 2022-09-14 LAB — VITAMIN B12: Vitamin B-12: 231 pg/mL (ref 180–914)

## 2022-09-14 NOTE — Assessment & Plan Note (Addendum)
Lab review 06/10/2020: WBC 4.6, hemoglobin 13.1, MCV 78.8 07/20/2022: WBC 6, hemoglobin 13.6, MCV 78   Patient was referred by Dr. Collene Mares.  She was noted to have microcytosis going on for a long time and therefore there was a clinical suspicion that she may have iron deficiency especially in the setting of black tarry stools.  since the colonoscopy the black tarry stools have subsided and her bowels have become normal.  I suspect that the patient might have a hemoglobinopathy like thalassemia. Send for hemoglobin electrophoresis and iron studies.  Telephone visit in 1 week to discuss results.

## 2022-09-14 NOTE — Progress Notes (Signed)
[facility] NOTE  Patient Care Team: Nicholas Lose, MD as PCP - General (Hematology and Oncology)  CHIEF COMPLAINTS/PURPOSE OF CONSULTATION:  Microcytosis  HISTORY OF PRESENTING ILLNESS:  Evelyn Clark 51 y.o. female is here because of longstanding history of microcytosis.  When her daughter was born she lost a lot of blood and she was anemic and she required iron infusion and this was about 5 years ago.  She has not required any iron therapy since then.  She did have positive Hemoccults and black-colored stools when Dr. Collene Mares performed upper endoscopy and colonoscopy she did not see any ulcers.  Interestingly after the endoscopies she stopped having the black stools.  I reviewed her records extensively and collaborated the history with the patient.  SUMMARY OF ONCOLOGIC HISTORY: Oncology History   No history exists.     MEDICAL HISTORY:  Past Medical History:  Diagnosis Date   Arthritis    Hemoglobin E trait (HCC)    Newborn product of in vitro fertilization (IVF) pregnancy    Vitamin D deficiency     SURGICAL HISTORY: Past Surgical History:  Procedure Laterality Date   GIVENS CAPSULE STUDY N/A 08/23/2022   Procedure: GIVENS CAPSULE STUDY;  Surgeon: Juanita Craver, MD;  Location: [facility];  Service: Gastroenterology;  Laterality: N/A;   WISDOM TOOTH EXTRACTION      SOCIAL HISTORY: Social History   Socioeconomic History   Marital status: Single    Spouse name: Not on file   Number of children: Not on file   Years of education: Not on file   Highest education level: Not on file  Occupational History   Not on file  Tobacco Use   Smoking status: Never   Smokeless tobacco: Never  Substance and Sexual Activity   Alcohol use: No   Drug use: No   Sexual activity: Never  Other Topics Concern   Not on file  Social History Narrative   Not on file   Social Determinants of Health   Financial Resource Strain: Not on file  Food Insecurity:  Not on file  Transportation Needs: Not on file  Physical Activity: Not on file  Stress: Not on file  Social Connections: Not on file  Intimate Partner Violence: Not on file    FAMILY HISTORY: Family History  Problem Relation Age of Onset   Hypertension Mother    Hyperlipidemia Mother    Osteoporosis Mother    Hypertension Father    Breast cancer Maternal Aunt     ALLERGIES:  is allergic to tape.  MEDICATIONS:  Current Outpatient Medications  Medication Sig Dispense Refill   Cholecalciferol (VITAMIN D3) 5000 units CAPS Take 5,000 Units by mouth daily.     Coenzyme Q10 (CO Q-10) 100 MG CAPS Take 100 mg by mouth daily.     cyanocobalamin (VITAMIN B12) 1000 MCG tablet Take 1,000 mcg by mouth daily.     melatonin 5 MG TABS Take 5 mg by mouth at bedtime as needed (sleep).     Multiple Vitamins-Minerals (EMERGEN-C IMMUNE PLUS) PACK Take 1 packet by mouth daily as needed (Immune boost).     Multiple Vitamins-Minerals (MULTIVITAMIN WITH MINERALS) tablet Take 1 tablet by mouth daily.     Turmeric 500 MG CAPS Take 500 mg by mouth daily.     No current facility-administered medications for this visit.    REVIEW OF SYSTEMS:   Constitutional: Denies fevers, chills or abnormal night sweats Breast:  Denies any palpable lumps or discharge All  other systems were reviewed with the patient and are negative.  PHYSICAL EXAMINATION: ECOG PERFORMANCE STATUS: 0 - Asymptomatic  Vitals:   09/14/22 1511  BP: 116/83  Pulse: 65  Resp: 16  Temp: 97.9 F (36.6 C)  SpO2: 100%   Filed Weights   09/14/22 1511  Weight: 115 lb 6.4 oz (52.3 kg)    GENERAL:alert, no distress and comfortable    LABORATORY DATA:  I have reviewed the data as listed Lab Results  Component Value Date   WBC 6.3 09/14/2022   HGB 13.2 09/14/2022   HCT 39.5 09/14/2022   MCV 78.2 (L) 09/14/2022   PLT 222 09/14/2022   Lab Results  Component Value Date   NA 138 06/10/2020   K 3.6 06/10/2020   CL 103 06/10/2020    CO2 25 06/10/2020    RADIOGRAPHIC STUDIES: I have personally reviewed the radiological reports and agreed with the findings in the report.  ASSESSMENT AND PLAN:  Microcytosis Lab review 06/10/2020: WBC 4.6, hemoglobin 13.1, MCV 78.8 07/20/2022: WBC 6, hemoglobin 13.6, MCV 78   Patient was referred by Dr. Loreta Ave.  She was noted to have microcytosis going on for a long time and therefore there was a clinical suspicion that she may have iron deficiency especially in the setting of black tarry stools.  since the colonoscopy the black tarry stools have subsided and her bowels have become normal.  I suspect that the patient might have a hemoglobinopathy like thalassemia. Send for hemoglobin electrophoresis and iron studies.  Telephone visit in 1 week to discuss results.    All questions were answered. The patient knows to call the clinic with any problems, questions or concerns.    Tamsen Meek, MD 09/14/22

## 2022-09-15 ENCOUNTER — Telehealth: Payer: Self-pay | Admitting: Hematology and Oncology

## 2022-09-15 LAB — FERRITIN: Ferritin: 106 ng/mL (ref 11–307)

## 2022-09-15 NOTE — Telephone Encounter (Signed)
Scheduled appointment per 10/11 los. Left voicemail.

## 2022-09-19 LAB — HGB FRACTIONATION BY HPLC
Hgb A: 67.5 % — ABNORMAL LOW (ref 96.4–98.8)
Hgb C: 0 %
Hgb E: 29.3 % — ABNORMAL HIGH
Hgb F: 0 % (ref 0.0–2.0)
Hgb S: 0 %
Hgb Variant: 0 %

## 2022-09-19 LAB — HGB FRACTIONATION CASCADE: Hgb A2: 3.2 % (ref 1.8–3.2)

## 2022-09-19 NOTE — Progress Notes (Signed)
HEMATOLOGY-ONCOLOGY TELEPHONE VISIT PROGRESS NOTE  I connected with our patient on 09/21/22 at  9:45 AM EDT by telephone and verified that I am speaking with the correct person using two identifiers.  I discussed the limitations, risks, security and privacy concerns of performing an evaluation and management service by telephone and the availability of in person appointments.  I also discussed with the patient that there may be a patient responsible charge related to this service. The patient expressed understanding and agreed to proceed.   History of Present Illness: Evelyn Clark 51 y.o. female is here because of longstanding history of microcytosis. She presents to the clinic today for via phone follow-up.  REVIEW OF SYSTEMS:   Constitutional: Denies fevers, chills or abnormal weight loss All other systems were reviewed with the patient and are negative.   Assessment Plan:  Microcytosis Lab review 06/10/2020: WBC 4.6, hemoglobin 13.1, MCV 78.8 07/20/2022: WBC 6, hemoglobin 13.6, MCV 78  09/14/22: Hb 13.2, MCV 78, B12: 231, Iron sat 46%, Ferritin: 106, Folate 16.2, Hb Electrophoresis: Hemoglobin E trait since the colonoscopy the black tarry stools have subsided and her bowels have become normal.  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 The Hb E variant is most common in the [place] and [place], where the frequency of the Hb E carrier state approaches 60 percent. It doesnot cause any clinical issues.  No treatment necessary RTC on an as needed basis   I discussed the assessment and treatment plan with the patient. The patient was provided an opportunity to ask questions and all were answered. The patient agreed with the plan and demonstrated an understanding of the instructions. The patient was advised to call back or seek an in-person evaluation if the symptoms worsen or if the condition fails to improve as anticipated.   I provided  12 minutes of non-face-to-face time during this encounter.  This includes time for charting and coordination of care   Harriette Ohara, MD  I Gardiner Coins am scribing for Dr. Lindi Adie  I have reviewed the above documentation for accuracy and completeness, and I agree with the above.

## 2022-09-21 ENCOUNTER — Inpatient Hospital Stay (HOSPITAL_BASED_OUTPATIENT_CLINIC_OR_DEPARTMENT_OTHER): Payer: 59 | Admitting: Hematology and Oncology

## 2022-09-21 DIAGNOSIS — R718 Other abnormality of red blood cells: Secondary | ICD-10-CM

## 2022-09-21 NOTE — Assessment & Plan Note (Signed)
Lab review 06/10/2020: WBC 4.6, hemoglobin 13.1, MCV 78.8 07/20/2022: WBC 6, hemoglobin 13.6, MCV 78  09/14/22: Hb 13.2, MCV 78, B12: 231, Iron sat 46%, Ferritin: 106, Folate 16.2, Hb Electrophoresis: Hemoglobin E trait since the colonoscopy the black tarry stools have subsided and her bowels have become normal.  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 The Hb E variant is most common in the [place] and [place], where the frequency of the Hb E carrier state approaches 60 percent. It doesnot cause any clinical issues.  No treatment necessary RTC on an as needed basis
